# Patient Record
Sex: Male | Born: 1975
Health system: Southern US, Community
[De-identification: ages and names within clinical notes are randomized; demographics above are authoritative.]

## PROBLEM LIST (undated history)

## (undated) DIAGNOSIS — Z0389 Encounter for observation for other suspected diseases and conditions ruled out: Secondary | ICD-10-CM

## (undated) DIAGNOSIS — K219 Gastro-esophageal reflux disease without esophagitis: Secondary | ICD-10-CM

## (undated) DIAGNOSIS — F339 Major depressive disorder, recurrent, unspecified: Secondary | ICD-10-CM

## (undated) DIAGNOSIS — E785 Hyperlipidemia, unspecified: Secondary | ICD-10-CM

## (undated) DIAGNOSIS — E669 Obesity, unspecified: Secondary | ICD-10-CM

## (undated) DIAGNOSIS — I10 Essential (primary) hypertension: Secondary | ICD-10-CM

## (undated) DIAGNOSIS — F411 Generalized anxiety disorder: Secondary | ICD-10-CM

## (undated) DIAGNOSIS — G4733 Obstructive sleep apnea (adult) (pediatric): Secondary | ICD-10-CM

## (undated) DIAGNOSIS — R7989 Other specified abnormal findings of blood chemistry: Secondary | ICD-10-CM

## (undated) DIAGNOSIS — K589 Irritable bowel syndrome without diarrhea: Secondary | ICD-10-CM

## (undated) DIAGNOSIS — K5790 Diverticulosis of intestine, part unspecified, without perforation or abscess without bleeding: Secondary | ICD-10-CM

## (undated) HISTORY — DX: Hyperlipidemia, unspecified: E78.5

## (undated) HISTORY — DX: Gastro-esophageal reflux disease without esophagitis: K21.9

## (undated) HISTORY — DX: Major depressive disorder, recurrent, unspecified: F33.9

## (undated) HISTORY — DX: Encounter for observation for other suspected diseases and conditions ruled out: Z03.89

## (undated) HISTORY — PX: APPENDECTOMY: SHX54

## (undated) HISTORY — PX: ADENOIDECTOMY: SUR15

## (undated) HISTORY — PX: TONSILLECTOMY: SUR1361

## (undated) HISTORY — DX: Other specified abnormal findings of blood chemistry: R79.89

## (undated) HISTORY — DX: Obesity, unspecified: E66.9

## (undated) HISTORY — DX: Obstructive sleep apnea (adult) (pediatric): G47.33

## (undated) HISTORY — PX: OTHER SURGICAL HISTORY: SHX169

## (undated) HISTORY — DX: Irritable bowel syndrome, unspecified: K58.9

## (undated) HISTORY — DX: Generalized anxiety disorder: F41.1

## (undated) HISTORY — DX: Essential (primary) hypertension: I10

---

## 1997-07-15 ENCOUNTER — Ambulatory Visit (HOSPITAL_COMMUNITY): Admission: RE | Admit: 1997-07-15 | Discharge: 1997-07-15 | Payer: Self-pay | Admitting: Pulmonary Disease

## 1998-02-09 ENCOUNTER — Emergency Department (HOSPITAL_COMMUNITY): Admission: EM | Admit: 1998-02-09 | Discharge: 1998-02-09 | Payer: Self-pay | Admitting: Emergency Medicine

## 1998-02-09 ENCOUNTER — Encounter: Payer: Self-pay | Admitting: Emergency Medicine

## 1999-07-12 ENCOUNTER — Encounter: Payer: Self-pay | Admitting: Emergency Medicine

## 1999-07-12 ENCOUNTER — Emergency Department (HOSPITAL_COMMUNITY): Admission: EM | Admit: 1999-07-12 | Discharge: 1999-07-12 | Payer: Self-pay | Admitting: Emergency Medicine

## 2000-06-07 ENCOUNTER — Encounter: Payer: Self-pay | Admitting: Emergency Medicine

## 2000-06-07 ENCOUNTER — Emergency Department (HOSPITAL_COMMUNITY): Admission: EM | Admit: 2000-06-07 | Discharge: 2000-06-07 | Payer: Self-pay | Admitting: Emergency Medicine

## 2000-11-16 ENCOUNTER — Ambulatory Visit (HOSPITAL_BASED_OUTPATIENT_CLINIC_OR_DEPARTMENT_OTHER): Admission: RE | Admit: 2000-11-16 | Discharge: 2000-11-16 | Payer: Self-pay | Admitting: Family Medicine

## 2001-02-25 ENCOUNTER — Emergency Department (HOSPITAL_COMMUNITY): Admission: EM | Admit: 2001-02-25 | Discharge: 2001-02-25 | Payer: Self-pay | Admitting: Emergency Medicine

## 2001-02-25 ENCOUNTER — Encounter: Payer: Self-pay | Admitting: Emergency Medicine

## 2001-02-26 ENCOUNTER — Emergency Department (HOSPITAL_COMMUNITY): Admission: EM | Admit: 2001-02-26 | Discharge: 2001-02-26 | Payer: Self-pay

## 2001-02-26 ENCOUNTER — Encounter: Payer: Self-pay | Admitting: Emergency Medicine

## 2001-04-02 ENCOUNTER — Encounter: Payer: Self-pay | Admitting: Emergency Medicine

## 2001-04-02 ENCOUNTER — Emergency Department (HOSPITAL_COMMUNITY): Admission: EM | Admit: 2001-04-02 | Discharge: 2001-04-02 | Payer: Self-pay | Admitting: *Deleted

## 2001-10-01 ENCOUNTER — Emergency Department (HOSPITAL_COMMUNITY): Admission: EM | Admit: 2001-10-01 | Discharge: 2001-10-01 | Payer: Self-pay

## 2001-10-17 ENCOUNTER — Emergency Department (HOSPITAL_COMMUNITY): Admission: EM | Admit: 2001-10-17 | Discharge: 2001-10-17 | Payer: Self-pay | Admitting: Emergency Medicine

## 2002-03-04 ENCOUNTER — Emergency Department (HOSPITAL_COMMUNITY): Admission: EM | Admit: 2002-03-04 | Discharge: 2002-03-04 | Payer: Self-pay | Admitting: Emergency Medicine

## 2002-03-04 ENCOUNTER — Encounter: Payer: Self-pay | Admitting: Emergency Medicine

## 2002-10-14 ENCOUNTER — Emergency Department (HOSPITAL_COMMUNITY): Admission: EM | Admit: 2002-10-14 | Discharge: 2002-10-14 | Payer: Self-pay | Admitting: Emergency Medicine

## 2002-10-14 ENCOUNTER — Encounter: Payer: Self-pay | Admitting: Emergency Medicine

## 2004-04-20 ENCOUNTER — Emergency Department (HOSPITAL_COMMUNITY): Admission: EM | Admit: 2004-04-20 | Discharge: 2004-04-20 | Payer: Self-pay | Admitting: Emergency Medicine

## 2004-04-30 ENCOUNTER — Observation Stay (HOSPITAL_COMMUNITY): Admission: EM | Admit: 2004-04-30 | Discharge: 2004-05-01 | Payer: Self-pay | Admitting: Emergency Medicine

## 2004-04-30 ENCOUNTER — Ambulatory Visit: Payer: Self-pay | Admitting: Internal Medicine

## 2004-09-19 ENCOUNTER — Emergency Department (HOSPITAL_COMMUNITY): Admission: EM | Admit: 2004-09-19 | Discharge: 2004-09-19 | Payer: Self-pay | Admitting: Emergency Medicine

## 2006-01-07 ENCOUNTER — Emergency Department (HOSPITAL_COMMUNITY): Admission: EM | Admit: 2006-01-07 | Discharge: 2006-01-07 | Payer: Self-pay | Admitting: Emergency Medicine

## 2006-02-06 ENCOUNTER — Emergency Department (HOSPITAL_COMMUNITY): Admission: EM | Admit: 2006-02-06 | Discharge: 2006-02-06 | Payer: Self-pay | Admitting: Emergency Medicine

## 2007-01-11 DIAGNOSIS — IMO0001 Reserved for inherently not codable concepts without codable children: Secondary | ICD-10-CM

## 2007-01-11 HISTORY — DX: Reserved for inherently not codable concepts without codable children: IMO0001

## 2007-05-11 ENCOUNTER — Ambulatory Visit (HOSPITAL_COMMUNITY): Admission: RE | Admit: 2007-05-11 | Discharge: 2007-05-11 | Payer: Self-pay | Admitting: *Deleted

## 2007-05-24 LAB — PULMONARY FUNCTION TEST

## 2007-05-25 ENCOUNTER — Ambulatory Visit (HOSPITAL_COMMUNITY): Admission: RE | Admit: 2007-05-25 | Discharge: 2007-05-25 | Payer: Self-pay | Admitting: Orthopedic Surgery

## 2007-05-30 ENCOUNTER — Ambulatory Visit: Payer: Self-pay | Admitting: Cardiology

## 2007-05-31 ENCOUNTER — Encounter (INDEPENDENT_AMBULATORY_CARE_PROVIDER_SITE_OTHER): Payer: Self-pay | Admitting: Internal Medicine

## 2007-05-31 ENCOUNTER — Inpatient Hospital Stay (HOSPITAL_COMMUNITY): Admission: EM | Admit: 2007-05-31 | Discharge: 2007-06-01 | Payer: Self-pay | Admitting: Emergency Medicine

## 2007-06-12 ENCOUNTER — Ambulatory Visit: Payer: Self-pay

## 2007-10-24 ENCOUNTER — Ambulatory Visit: Payer: Self-pay | Admitting: Internal Medicine

## 2007-10-24 LAB — CONVERTED CEMR LAB
CO2: 28 meq/L (ref 19–32)
Calcium: 9.1 mg/dL (ref 8.4–10.5)
Eosinophils Absolute: 0.4 10*3/uL (ref 0.0–0.7)
Eosinophils Relative: 5.8 % — ABNORMAL HIGH (ref 0.0–5.0)
GFR calc Af Amer: 110 mL/min
Glucose, Bld: 89 mg/dL (ref 70–99)
HCT: 44.5 % (ref 39.0–52.0)
Hemoglobin: 15.4 g/dL (ref 13.0–17.0)
Lymphocytes Relative: 25.8 % (ref 12.0–46.0)
MCHC: 34.5 g/dL (ref 30.0–36.0)
Monocytes Relative: 12.1 % — ABNORMAL HIGH (ref 3.0–12.0)
Neutro Abs: 4.2 10*3/uL (ref 1.4–7.7)
RBC: 5.09 M/uL (ref 4.22–5.81)
RDW: 12.9 % (ref 11.5–14.6)
Sodium: 141 meq/L (ref 135–145)

## 2007-10-26 ENCOUNTER — Ambulatory Visit: Payer: Self-pay | Admitting: Internal Medicine

## 2007-10-26 ENCOUNTER — Ambulatory Visit (HOSPITAL_COMMUNITY): Admission: RE | Admit: 2007-10-26 | Discharge: 2007-10-26 | Payer: Self-pay | Admitting: Cardiology

## 2008-06-16 ENCOUNTER — Telehealth: Payer: Self-pay | Admitting: Internal Medicine

## 2008-06-17 ENCOUNTER — Ambulatory Visit: Payer: Self-pay | Admitting: Internal Medicine

## 2008-06-17 DIAGNOSIS — K219 Gastro-esophageal reflux disease without esophagitis: Secondary | ICD-10-CM | POA: Insufficient documentation

## 2008-06-17 DIAGNOSIS — R1310 Dysphagia, unspecified: Secondary | ICD-10-CM | POA: Insufficient documentation

## 2008-06-17 DIAGNOSIS — R197 Diarrhea, unspecified: Secondary | ICD-10-CM

## 2008-06-17 DIAGNOSIS — R1084 Generalized abdominal pain: Secondary | ICD-10-CM | POA: Insufficient documentation

## 2008-06-17 DIAGNOSIS — K589 Irritable bowel syndrome without diarrhea: Secondary | ICD-10-CM

## 2008-06-17 DIAGNOSIS — K210 Gastro-esophageal reflux disease with esophagitis: Secondary | ICD-10-CM

## 2008-06-17 DIAGNOSIS — R1319 Other dysphagia: Secondary | ICD-10-CM

## 2008-06-18 ENCOUNTER — Encounter: Payer: Self-pay | Admitting: Internal Medicine

## 2008-06-18 LAB — CONVERTED CEMR LAB
ALT: 24 units/L (ref 0–53)
Albumin: 3.8 g/dL (ref 3.5–5.2)
Basophils Absolute: 0 10*3/uL (ref 0.0–0.1)
CO2: 29 meq/L (ref 19–32)
Calcium: 8.9 mg/dL (ref 8.4–10.5)
Chloride: 111 meq/L (ref 96–112)
GFR calc non Af Amer: 101.99 mL/min (ref >60)
Glucose, Bld: 99 mg/dL (ref 70–99)
HCT: 44.5 % (ref 39.0–52.0)
Lymphocytes Relative: 23.9 % (ref 12.0–46.0)
Lymphs Abs: 1.9 10*3/uL (ref 0.7–4.0)
Monocytes Relative: 9.3 % (ref 3.0–12.0)
Neutro Abs: 5 10*3/uL (ref 1.4–7.7)
Neutrophils Relative %: 62.1 % (ref 43.0–77.0)
Platelets: 207 10*3/uL (ref 150.0–400.0)
Potassium: 4.3 meq/L (ref 3.5–5.1)
RBC: 5.02 M/uL (ref 4.22–5.81)
Sodium: 143 meq/L (ref 135–145)

## 2008-06-19 ENCOUNTER — Telehealth (INDEPENDENT_AMBULATORY_CARE_PROVIDER_SITE_OTHER): Payer: Self-pay | Admitting: *Deleted

## 2008-06-23 ENCOUNTER — Ambulatory Visit: Payer: Self-pay | Admitting: Internal Medicine

## 2008-06-24 ENCOUNTER — Encounter (INDEPENDENT_AMBULATORY_CARE_PROVIDER_SITE_OTHER): Payer: Self-pay | Admitting: *Deleted

## 2008-07-16 ENCOUNTER — Ambulatory Visit: Payer: Self-pay | Admitting: Internal Medicine

## 2009-04-01 ENCOUNTER — Encounter: Payer: Self-pay | Admitting: Internal Medicine

## 2009-10-12 ENCOUNTER — Encounter: Payer: Self-pay | Admitting: Internal Medicine

## 2009-11-25 ENCOUNTER — Telehealth: Payer: Self-pay | Admitting: Internal Medicine

## 2009-12-16 ENCOUNTER — Telehealth: Payer: Self-pay | Admitting: Internal Medicine

## 2009-12-30 ENCOUNTER — Ambulatory Visit: Payer: Self-pay | Admitting: Internal Medicine

## 2009-12-31 ENCOUNTER — Telehealth (INDEPENDENT_AMBULATORY_CARE_PROVIDER_SITE_OTHER): Payer: Self-pay | Admitting: *Deleted

## 2010-01-06 ENCOUNTER — Emergency Department (HOSPITAL_COMMUNITY)
Admission: EM | Admit: 2010-01-06 | Discharge: 2010-01-06 | Payer: Self-pay | Source: Home / Self Care | Admitting: Emergency Medicine

## 2010-02-01 ENCOUNTER — Encounter: Payer: Self-pay | Admitting: Otolaryngology

## 2010-02-11 NOTE — Letter (Signed)
Summary: [facility]  [facility]   Imported By: Sherian Rein 10/27/2009 08:45:31  _____________________________________________________________________  External Attachment:    Type:   Image     Comment:   External Document

## 2010-02-11 NOTE — Progress Notes (Signed)
Summary: DO NOT RESCHEDULE  Phone Note Call from Patient   Caller: [email or web address]  Call For: wert Summary of Call: Do you want to reschedule this pt again, he has 3 no-shows and 3 cancellations. Initial call taken by: Darletta Moll,  December 31, 2009 9:13 AM  Follow-up for Phone Call        do not reschedule  Follow-up by: Nyoka Cowden MD,  December 31, 2009 11:48 AM

## 2010-02-11 NOTE — Medication Information (Signed)
[facility]: [facility]   Imported By: Lester Grimsley 04/29/2009 08:22:18  _____________________________________________________________________  External Attachment:    Type:   Image     Comment:   External Document

## 2010-02-11 NOTE — Progress Notes (Signed)
Summary: nos appt  Phone Note Call from Patient   Caller: [email or web address]  Call For: wert Summary of Call: Rsc nos from 11/15 to 12/6. Initial call taken by: Darletta Moll,  November 25, 2009 11:28 AM

## 2010-02-11 NOTE — Progress Notes (Signed)
Summary: nos appt  Phone Note Call from Patient   Caller: [email or web address]  Call For: wert Summary of Call: Rsc nos from 12/6 to 12/21. Initial call taken by: Darletta Moll,  December 16, 2009 11:22 AM

## 2010-02-17 ENCOUNTER — Emergency Department (HOSPITAL_COMMUNITY)
Admission: EM | Admit: 2010-02-17 | Discharge: 2010-02-17 | Disposition: A | Discharge status: Home / Self Care | Payer: Medicare Other | Attending: Emergency Medicine | Admitting: Emergency Medicine

## 2010-02-17 ENCOUNTER — Emergency Department (HOSPITAL_COMMUNITY): Payer: Medicare Other

## 2010-02-17 DIAGNOSIS — T148XXA Other injury of unspecified body region, initial encounter: Secondary | ICD-10-CM | POA: Insufficient documentation

## 2010-02-17 DIAGNOSIS — M79609 Pain in unspecified limb: Secondary | ICD-10-CM | POA: Insufficient documentation

## 2010-02-17 DIAGNOSIS — W268XXA Contact with other sharp object(s), not elsewhere classified, initial encounter: Secondary | ICD-10-CM | POA: Insufficient documentation

## 2010-02-17 DIAGNOSIS — L089 Local infection of the skin and subcutaneous tissue, unspecified: Secondary | ICD-10-CM | POA: Insufficient documentation

## 2010-05-25 NOTE — H&P (Signed)
NAMEPAWAN, KNECHTEL                 ACCOUNT NO.:  [id number]   MEDICAL RECORD NO.:  [id number]          PATIENT TYPE:  INP   LOCATION:  1823                         FACILITY:  [facility]   PHYSICIAN:  Lucita Ferrara, MD         DATE OF BIRTH:  11/21/1975   DATE OF ADMISSION:  05/30/2007  DATE OF DISCHARGE:                              HISTORY & PHYSICAL   HISTORY OF PRESENT ILLNESS:  The patient is a 35 year old with chief  complaint of chest pain was brought in to [facility]  status post removal of a pin from the right femur.  Chest pain is  located on the right side of the chest worse upon inspiration.  It  radiates to right arm.  It is pleuritic in nature.  It is 5/10 in  intensity, some cough and sputum production that is nonproductive.  He  complains of generalized malaise, fatigue and shortness of breath.  No  orthopnea, paroxysmal nocturnal dyspnea or lower extremity edema.  Otherwise, a 12-point review of system is negative.  Again, the patient  recently underwent an orthopedic procedure.  His past medical history is  significant for gastroesophageal reflux disease and asthmatic  bronchitis.   SOCIAL HISTORY:  Denies drugs alcohol or tobacco.   MEDICATIONS:  He is on Roxicet and he is unsure of the other medications  he is on.   FAMILY HISTORY:  Noncontributory   PHYSICAL EXAMINATION:  GENERAL:  The patient is in moderate distress  from his pain when he breathes in, otherwise, no other acute distress.  VITAL SIGNS:  Temperature 90.9, blood pressure 134/86, pulse 105,  respirations 18, pulse ox 97% on room air.  HEENT:  Mucous membranes are dry.  CARDIOVASCULAR:  S1-S2, regular rate and rhythm.  No murmurs, rubs or  clicks.  ABDOMEN:  Soft, nontender, nondistended.  Positive bowel sounds.  LUNGS: There are decreased breath sounds of right lung.  Otherwise,  clear to auscultation.  Some mild wheezing bilaterally expiratory  wheezes.  No rhonchi or rales.  EXTREMITIES:  No clubbing, cyanosis or edema.  NEUROLOGICAL:  The patient is alert and oriented x3.  Cranial nerves II-  XII grossly intact.  ABDOMEN:  Soft, nontender, nondistended.  Positive bowel sounds.   LABORATORY DATA:  The patient had a lipase which is 20.  Complete  metabolic panel within normal limits.  Lipase 20.  First set of cardiac  enzymes mildly elevated troponin of 0.09, second set of troponin 0.18.   RADIOLOGICAL DATA:  The patient has CT angio which was negative for  pulmonary embolism but showed a lung nodule.  Chest x-ray shows patchy  opacity of the right lung base, atelectasis versus airspace disease.  There is a thoracic kyphosis noted.   ASSESSMENT/PLAN:  A 35 year old with chest pain and a questionable right  lung opacity, mild leukocytosis and pleuritic-type chest pain that  really raises suspicion of pneumonia, rule out acute coronary syndrome  given his prior positive cardiac enzymes which could likely be secondary  to his pneumonic process.  Note that a D-dimer  was positive and a CT  angio was performed and PE was ruled out essentially.  The patient is  status post orthopedic procedure which increases the risk of DVT and PE.  1. Chest pain, right lung opacity, likely infectious process.  Will go      ahead and initiate antibiotics with Levaquin IV daily, nebulizer      treatments, supplemental oxygen, blood cultures, sputum culture.  2. Positive cardiac enzymes could be secondary to #1 but will go ahead      and rule out acute coronary syndrome.  Will get an EKG.  Will start      therapeutic Lovenox, dose now.  Cardiology consults will be      required.  A 2-D echocardiogram will be ordered.  The patient      likely needs a catheterization during this admission.  This is per      Cardiology.  3. Positive D-dimer is negative.  CT angio.  4. DVT prophylaxis with therapeutic Lovenox.  GI prophylaxis with      Protonix.  The rest of the plans and procedures  are dependent on      the patient's progress.      Lucita Ferrara, MD  Electronically Signed     RR/MEDQ  D:  05/31/2007  T:  05/31/2007  Job:  [id number]

## 2010-05-25 NOTE — Cardiovascular Report (Signed)
William Mcdonald, William Mcdonald                 ACCOUNT NO.:  [id number]   MEDICAL RECORD NO.:  [id number]          PATIENT TYPE:  OIB   LOCATION:  2899                         FACILITY:  [facility]   PHYSICIAN:  Bevelyn Buckles. Bensimhon, MDDATE OF BIRTH:  [birth year]   DATE OF PROCEDURE:  DATE OF DISCHARGE:  10/26/2007                            CARDIAC CATHETERIZATION   PATIENT IDENTIFICATION:  William Mcdonald is a 35 year old male who was  admitted recently with chest pain.  Troponin was mildly positive.  He  had a Myoview, which showed no evidence of no perfusion defects;  however, he has continued to have chest pain and severe dyspnea.  He is  brought for right and left catheterization.   PROCEDURES PERFORMED:  1. Right heart catheterization.  2. Left heart catheterization.  3. Slash coronary angiography.  4. Left ventriculogram.   DESCRIPTION OF PROCEDURE:  The risks and indications were explained.  Consent was signed and placed on the chart.  A 5-French arterial sheath  was placed in the right femoral artery using a standard modified  Seldinger technique.  Standard catheters including a JL-4, JR-4, and  angled pigtail used for the procedure.  All catheter exchanges were made  over wire.  A 7-French sheath was placed in the right femoral vein.  Using modified Seldinger technique, standard Swan-Ganz catheter was used  for right heart cath.  There is no apparent complications.   HEMODYNAMICS:  Right atrial pressure was mean of 1; RV pressure 21/1; PA  pressure 23/5, with a mean of 13; pulmonary capillary wedge pressure was  4; a central aortic pressure is 124/85, with a mean of 104.  LV pressure  118/2, with an EDP of 7.   Fick cardiac output was 5.8, with a cardiac index of 2.9.   Coronary artery:  Coronary anatomy left main was normal.   LAD was a long vessel coursing to the apex.  It gave off a large  diagonal, it was angiographically normal.   Left circumflex was a large vessel gave off several  marginal branches is  angiographically normal.   Right coronary artery was a dominant vessel.  It was also  angiographically normal.   Left ventriculogram done in the RAO projection showed ejection fraction  of approximately 55-60%.  No regional wall motion abnormalities.   ASSESSMENT:  1. Normal coronary arteries.  2. Normal left ventricular function.  3. Normal right-sided pressures.   Given his catheterization, his shortness of breath and chest pain  appeared to be noncardiac.  We will proceed with cardiopulmonary  exercise test to rule out exercise-induced asthma.  He may also need a  gastrointestinal workup.      Bevelyn Buckles. Bensimhon, MD  Electronically Signed     DRB/MEDQ  D:  10/26/2007  T:  10/27/2007  Job:  [id number]
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
## 2010-05-25 NOTE — Op Note (Signed)
NAMEJERALD, William Mcdonald                 ACCOUNT NO.:  [id number]   MEDICAL RECORD NO.:  [id number]          PATIENT TYPE:  AMB   LOCATION:  SDS                          FACILITY:  [facility]   PHYSICIAN:  Harvie Junior, M.D.   DATE OF BIRTH:  24-Dec-1975   DATE OF PROCEDURE:  06/20/2007  DATE OF DISCHARGE:  05/25/2007                               OPERATIVE REPORT   PREOPERATIVE DIAGNOSIS:  Retained hardware right leg status post  intramedullary rodding.   POSTOPERATIVE DIAGNOSES:  1. Retained hardware, right leg status post intramedullary rodding      (excision of trochanteric bursa right).  2. Removal of femoral rod from a proximal incision.  3. Removal of proximal femoral screw from a separate proximal      incision.  4. Removal of distal femoral screws from a lateral distal incision.   SURGEON:  Harvie Junior, MD   ASSISTANT:  Marshia Ly, PA   ANESTHESIA:  General.   BRIEF HISTORY:  [name] is a gentleman who underwent intramedullary  rodding of his right femur prolonged time ago.  He had been bothered by  this for a period of time and we have been followed and conservatively  have been avoiding surgical invention because we felt that he would just  get better.  He continued to complain of pain over that hip.  He  certainly seemed to have some infected and inflamed bursa.  We had  injected that was seemed to have helped, but he continued to complain of  pain.  The patient was ultimately taken to the operating room for  removal of rod.   PROCEDURE:  The patient was taken to the operating room.  After adequate  anesthesia obtained with general anesthetic, the patient was placed in  the left lateral decubitus position, all bony prominences were well  padded.  Attention then turned to the right hip where a distal incision  was made.  Subcutaneous tissue dissected in the level of the lateral  distal femur and the 2 distal femoral screws were removed through the  distal incision.   Attention then turned proximally where a small stab  incision was made over the proximal wound and the proximal interlocking  screw was removed through the small wound.  A fairly generous incision  was made over the proximal aspect of the hip as this was just going  through the old incision.  Once this was done, we dissected down to the  lateral aspect to the hip.  He had a tremendous amount of trochanteric  inflammation, trochanteric bursa, and a subtotal trochanteric bursectomy  was performed.  At this point, the top of the rod was identifiable and  this top of the rod was cleansed of all of the healing tissue and scar  tissue around it.  Once this was done, the extracting rod was placed  into the proximal rod and the rod that was removed.  The wound was  copiously and thoroughly irrigated at this point as well as pulsatile  lavage of the femoral shaft, and at this point, the proximal  wound was  closed in layers.  The other 2 incisions were closed with the skin  staples.  Sterile compression dressings were applied.  The patient was  taken to  the recovery room and was noted to be in satisfactory condition.  Estimated blood loss during the procedure was less than 100 mL.   Of note, Marshia Ly assisted throughout the case and its completion.      Harvie Junior, M.D.  Electronically Signed     JLG/MEDQ  D:  06/20/2007  T:  06/21/2007  Job:  [id number]

## 2010-05-25 NOTE — Assessment & Plan Note (Signed)
[facility]                            CARDIOLOGY OFFICE NOTE   KEEON, Mcdonald                        MRN:          [id number]  DATE:10/24/2007                            DOB:          1975/09/21    PRIMARY Viviana Trimble:  Paulene Floor [facility]   William Mcdonald is a very pleasant 35 year old male with a family history of  premature coronary artery disease.  He was admitted back in May with  chest pain.  His troponin was just minimally elevated at 0.18.  EKG  showed inferior T-waves.  However, his pain was somewhat atypical.  He  had a CT scan which showed no evidence of pulmonary embolus.  This was  followed by Myoview which showed a possible minimal defect in the  inferior apical wall.  This was thought not to be clinically  significant.  EF was 53% and by echocardiogram was also 50%.   He continues to have chest pains.  He says what really happens is he  gets short of breath and then he develops chest pain.  This happens  mostly at night.  He has been evaluated by ENT and started on a proton  pump inhibitor without any benefit.  He has been found to have a  deviated septum and this is going to be repaired in the near future.  There is also some question of possible laryngeal dysfunction as he has  a hard time swallowing pills and often gags.  They are working this up  as well.   He also gets the pain when he is exerting himself.  Walking at a normal  pace, he has no problems.  When he takes a long walk or goes faster he  develops shortness of breath and has chest pain.  He has been followed  by [facility].  They started him on albuterol  inhaler which helped a bit but then apparently he had PFTs which did not  show a reactive airway disease at rest.   He says that he has his discomfort and shortness of breath just about  every day and seems to be progressive.   CURRENT MEDICATIONS:  None currently.   EKG shows sinus bradycardia with sinus arrhythmia at a rate of 51.  There are lateral T-wave inversions which are old.   PHYSICAL EXAM:  CONSTITUTIONAL:  He is in no acute distress.  Ambulates  around the clinic without any respiratory difficulty.  VITAL SIGNS:  Blood pressure 120/82, heart rate 51 weight is 202.  HEENT is normal.  NECK:  Supple.  No obvious JVD.  Carotids 2+ bilaterally without bruits.  There is no lymphadenopathy or thyromegaly.  CARDIAC:  Regular rate and rhythm.  No murmurs, rubs or gallops.  LUNGS:  Clear.  No wheezing.  ABDOMEN:  Obese, nontender, nondistended with no hepatosplenomegaly, no  bruits, no masses.  Good bowel sounds.  EXTREMITIES:  Warm with no cyanosis, clubbing or edema.  No rash.  NEURO:  Alert and oriented x3.  Cranial nerves II-XII are intact.  Moves  all 4 extremities  without difficulty.  Affect is pleasant.   ASSESSMENT/PLAN:  Shortness of breath and chest pain with an abnormal  Myoview and mildly elevated troponins.  I had a long talk with Mr. Bussey  and his wife about his symptoms.  I am concerned about possible  underlying coronary artery disease.  We will proceed with right and left  heart catheterization in the cardiac catheterization lab on Friday.  We  had a long discussion about the risks and indication.  He agrees to  proceed.     Bevelyn Buckles. Bensimhon, MD  Electronically Signed    DRB/MedQ  DD: 10/24/2007  DT: 10/24/2007  Job #: [id number]   cc:   Paulene Floor

## 2010-05-25 NOTE — Discharge Summary (Signed)
[name], [name]                 ACCOUNT NO.:  [id number]   MEDICAL RECORD NO.:  [id number]           PATIENT TYPE:   LOCATION:                                 FACILITY:   PHYSICIAN:  Elliot Cousin, M.D.    DATE OF BIRTH:  06-27-1975   DATE OF ADMISSION:  05/31/2007  DATE OF DISCHARGE:  06/01/2007                               DISCHARGE SUMMARY   DISCHARGE DIAGNOSES:  1. Atypical chest pain associated with abdominal pain, thought to be      secondary to constipation.  2. Mildly elevated troponin I on the cardiac marker panel, however,      cardiac enzymes were within normal limits, otherwise.  3. Lateral T-wave inversions.  An outpatient Myoview stress test has      been scheduled for June 12, 2007, per Dr. Gala Romney.  4. Mild dyslipidemia.  5. Constipation.   DISCHARGE MEDICATIONS:  None.   DISCHARGE DISPOSITION:  The patient is currently being discharged to  home in improved and stable condition.  He was advised to follow up with  [facility] on June 12, 2007, for the outpatient Myoview stress  test.  He was also advised to follow up with Dr. Gala Romney on July 02, 2007.   CONSULTATIONS:  Cardiologist, Lowella Bandy, MD and Bevelyn Buckles. Bensimhon,  MD.   PROCEDURES PERFORMED:  1. Ultrasound of the abdomen on May 31, 2007.  The results revealed no      evidence of cholelithiasis or cholecystitis.  Simple right renal      cyst.  2. CT angiogram of the chest on May 31, 2007.  The results revealed      negative CT scan of the chest.  No evidence for pulmonary embolism.      A 3-mm nodule in the right upper lobe, otherwise lungs are clear.      The right pulmonary nodule is likely an incidental finding.  3. A 2-D echocardiogram performed on May 31, 2007.  The results      revealed an ejection fraction estimated to be 45%-50% with mild      diffuse left ventricular hypokinesis.  Per Dr. Prescott Gum      assessment, the patient's ejection fraction ranges from 50%-55%.   HISTORY OF PRESENT ILLNESS:  The patient is a 36 year old man with a  recent history of right lower extremity orthopedic surgery, who  presented to the emergency department on May 31, 2007, with a chief  complaint of pleuritic right-sided chest pain.  When the patient was  evaluated in the emergency department, his troponin I was elevated at  0.09.  His EKG revealed nonspecific lateral T-wave abnormalities.  His  ABG as ordered by the emergency department physician revealed a pH of  7.4, pCO2 of 38, and pO2 of 80.  A D-dimer was ordered as well and it  was elevated at 0.78.  Given the elevated D-dimer, a CT scan of the  chest was ordered to rule out PE.  The CT scan of the chest did not  reveal any evidence of pulmonary embolism.  The  patient, however, was  admitted for further evaluation and management.   For additional details, please see the dictated history and physical.   HOSPITAL COURSE:  1. ATYPICAL CHEST PAIN WITH ASSOCIATED MILD ABDOMINAL PAIN.  The      patient was started on heparin intravenously empirically given the      elevated troponin I on the cardiac marker panel from the emergency      department.  The patient's pain was treated with as-needed      oxycodone and as-needed Dilaudid.  Nitroglycerin sublingually was      ordered, however, the patient refused it.  There was a question of      a right-sided opacity on the chest x-ray, which could have been      construed as infection.  However, the patient denied any complaints      of subjective fever or a purulent cough.  Nevertheless, the patient      was started on Levaquin and azithromycin intravenously.  Following      the results of the CT scan which revealed clear lungs, the Levaquin      and subsequently the azithromycin were both discontinued.  Upon      further questioning of the patient, he felt that the pain was more      abdominal and not really chest related. Therefore, the patient was      started on  symptomatic treatment with Mylanta p.r.n.  He was      previously started on Protonix prophylactically.  In addition, the      patient complained of constipation and stated that he had not had a      bowel movement in 3-4 days.  He attributed the constipation to      taking chronic opioid pain medications for leg pain following      surgery.   For additional evaluation, cardiac enzymes were ordered as well as a 2-D  echocardiogram.  The patient's cardiac enzymes were completely within  normal limits.  His 2-D echocardiogram revealed a slightly depressed LV  function with an ejection fraction estimated to be 45%.  In addition,  the patient's TSH was within normal limits at 1.9.  His liver  transaminases were within normal limits.  His fasting lipid panel  revealed a total cholesterol of 168, triglycerides of 146, HDL  cholesterol of 27, and LDL cholesterol of 112.  No pharmacological  treatment was initiated.  The patient, however, was advised to follow a  heart-healthy diet.  His urine drug screen was negative with the  exception of opioids, which he took previously because of pain.   For further evaluation, cardiology was consulted.  Dr. Nevin Bloodgood provided  the initial consultation followed by Dr. Gala Romney.  They both agree  with medical management.  Dr. Gala Romney evaluated the patient and then  took a look at the 2-D echocardiogram.  Per his assessment, the  patient's ejection fraction was actually higher, ranging from 50%-55%.  However, given the EKG changes, Dr. Gala Romney recommended an outpatient  Myoview stress test, which has been arranged.  Because of the patient's  GI symptoms, an abdominal ultrasound was ordered.  The abdominal  ultrasound revealed no evidence of acute cholecystitis or any evidence  of gallstones.  The patient was treated symptomatically with MiraLax,  milk of magnesia, and Senokot-S.  Following these measures, he did have  at least 2 bowel movements.  Following  the bowel movements, the  patient's pain completely resolved.  The patient will be started on a solid food today and if he can tolerate  it, he will be discharged home with close followup by [facility].      Elliot Cousin, M.D.  Electronically Signed     DF/MEDQ  D:  06/01/2007  T:  06/02/2007  Job:  [id number]   cc:   Bevelyn Buckles. Bensimhon, MD
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
## 2010-08-24 ENCOUNTER — Encounter: Payer: Self-pay | Admitting: Internal Medicine

## 2010-08-25 ENCOUNTER — Ambulatory Visit: Payer: Medicare Other | Admitting: Internal Medicine

## 2010-10-06 LAB — COMPREHENSIVE METABOLIC PANEL
ALT: 41
AST: 33
Albumin: 3.5
Alkaline Phosphatase: 62
BUN: 19
Creatinine, Ser: 1.12
GFR calc non Af Amer: >60
Potassium: 3.9
Sodium: 138

## 2010-10-06 LAB — CBC
HCT: 44.9
HCT: 45.8
Hemoglobin: 15.1
MCHC: 33.6
MCHC: 35.1
MCV: 87.9
MCV: 87.4
MCV: 87.8
Platelets: 313
Platelets: 318
RBC: 5.11
RBC: 4.91
RDW: 13
WBC: 12.2 — ABNORMAL HIGH
WBC: 12.4 — ABNORMAL HIGH

## 2010-10-06 LAB — LIPID PANEL
Cholesterol: 168
HDL: 27 — ABNORMAL LOW
LDL Cholesterol: 112 — ABNORMAL HIGH
Triglycerides: 146

## 2010-10-06 LAB — BASIC METABOLIC PANEL
Chloride: 100
GFR calc Af Amer: >60
Potassium: 4.2

## 2010-10-06 LAB — URINALYSIS, ROUTINE W REFLEX MICROSCOPIC
Bilirubin Urine: NEGATIVE
Hgb urine dipstick: NEGATIVE
Ketones, ur: NEGATIVE

## 2010-10-06 LAB — D-DIMER, QUANTITATIVE: D-Dimer, Quant: 0.78 — ABNORMAL HIGH

## 2010-10-06 LAB — RAPID URINE DRUG SCREEN, HOSP PERFORMED
Barbiturates: NOT DETECTED
Cocaine: NOT DETECTED

## 2010-10-06 LAB — CARDIAC PANEL(CRET KIN+CKTOT+MB+TROPI)
CK, MB: 1.2
CK, MB: 1.3
Relative Index: 0.7
Total CK: 124
Total CK: 155
Total CK: 185
Troponin I: 0.01
Troponin I: <0.01

## 2010-10-06 LAB — DIFFERENTIAL
Basophils Absolute: 0
Basophils Relative: 0
Eosinophils Absolute: 0.7
Monocytes Relative: 8
Neutrophils Relative %: 68

## 2010-10-06 LAB — POCT I-STAT 3, ART BLOOD GAS (G3+)
Bicarbonate: 25.5 — ABNORMAL HIGH
Operator id: 284591
pH, Arterial: 7.432
pO2, Arterial: 80

## 2010-10-06 LAB — HEMOGLOBIN A1C
Hgb A1c MFr Bld: 5.7
Mean Plasma Glucose: 126

## 2010-10-06 LAB — POCT CARDIAC MARKERS: Troponin i, poc: 0.09 — ABNORMAL HIGH

## 2010-10-06 LAB — HEPATIC FUNCTION PANEL
ALT: 46
AST: 32
Indirect Bilirubin: 1 — ABNORMAL HIGH
Total Protein: 6.8

## 2010-10-06 LAB — PROTIME-INR: INR: 1.1

## 2010-10-06 LAB — CK TOTAL AND CKMB (NOT AT ARMC)
CK, MB: 1.3
Total CK: 205

## 2010-10-06 LAB — URINE CULTURE
Colony Count: NO GROWTH
Culture: NO GROWTH

## 2010-10-06 LAB — HEPARIN LEVEL (UNFRACTIONATED): Heparin Unfractionated: 0.26 — ABNORMAL LOW

## 2010-10-06 LAB — HOMOCYSTEINE: Homocysteine: 10.7

## 2010-10-06 LAB — LIPASE, BLOOD: Lipase: 20

## 2010-10-11 LAB — POCT I-STAT 3, VENOUS BLOOD GAS (G3P V)
Bicarbonate: 25 — ABNORMAL HIGH
TCO2: 24
TCO2: 26
pCO2, Ven: 41.7 — ABNORMAL LOW
pCO2, Ven: 42.6 — ABNORMAL LOW
pH, Ven: 7.349 — ABNORMAL HIGH
pH, Ven: 7.376 — ABNORMAL HIGH
pO2, Ven: 38

## 2010-10-11 LAB — POCT I-STAT 3, ART BLOOD GAS (G3+)
O2 Saturation: 94
TCO2: 26
pH, Arterial: 7.413

## 2011-10-25 ENCOUNTER — Encounter: Payer: Self-pay | Admitting: Family Medicine

## 2011-10-25 ENCOUNTER — Other Ambulatory Visit: Payer: Self-pay | Admitting: Family Medicine

## 2011-10-25 ENCOUNTER — Ambulatory Visit (INDEPENDENT_AMBULATORY_CARE_PROVIDER_SITE_OTHER): Payer: Medicare Other | Admitting: Family Medicine

## 2011-10-25 VITALS — BP 151/99 | HR 60 | Temp 97.3°F | Ht 65.0 in | Wt 210.0 lb

## 2011-10-25 DIAGNOSIS — G4733 Obstructive sleep apnea (adult) (pediatric): Secondary | ICD-10-CM | POA: Insufficient documentation

## 2011-10-25 DIAGNOSIS — R3911 Hesitancy of micturition: Secondary | ICD-10-CM | POA: Diagnosis not present

## 2011-10-25 DIAGNOSIS — K589 Irritable bowel syndrome without diarrhea: Secondary | ICD-10-CM

## 2011-10-25 DIAGNOSIS — R197 Diarrhea, unspecified: Secondary | ICD-10-CM

## 2011-10-25 DIAGNOSIS — K219 Gastro-esophageal reflux disease without esophagitis: Secondary | ICD-10-CM | POA: Diagnosis not present

## 2011-10-25 DIAGNOSIS — G4730 Sleep apnea, unspecified: Secondary | ICD-10-CM

## 2011-10-25 DIAGNOSIS — I10 Essential (primary) hypertension: Secondary | ICD-10-CM | POA: Diagnosis not present

## 2011-10-25 DIAGNOSIS — Z0000 Encounter for general adult medical examination without abnormal findings: Secondary | ICD-10-CM

## 2011-10-25 DIAGNOSIS — R5381 Other malaise: Secondary | ICD-10-CM | POA: Diagnosis not present

## 2011-10-25 DIAGNOSIS — E785 Hyperlipidemia, unspecified: Secondary | ICD-10-CM

## 2011-10-25 DIAGNOSIS — R1013 Epigastric pain: Secondary | ICD-10-CM | POA: Diagnosis not present

## 2011-10-25 DIAGNOSIS — J45909 Unspecified asthma, uncomplicated: Secondary | ICD-10-CM

## 2011-10-25 DIAGNOSIS — R7989 Other specified abnormal findings of blood chemistry: Secondary | ICD-10-CM

## 2011-10-25 DIAGNOSIS — J309 Allergic rhinitis, unspecified: Secondary | ICD-10-CM

## 2011-10-25 DIAGNOSIS — E291 Testicular hypofunction: Secondary | ICD-10-CM

## 2011-10-25 DIAGNOSIS — R5383 Other fatigue: Secondary | ICD-10-CM

## 2011-10-25 DIAGNOSIS — E669 Obesity, unspecified: Secondary | ICD-10-CM

## 2011-10-25 LAB — POCT URINALYSIS DIPSTICK
Glucose, UA: NEGATIVE
Ketones, UA: NEGATIVE
Leukocytes, UA: NEGATIVE
Protein, UA: NEGATIVE
Urobilinogen, UA: 0.2

## 2011-10-25 MED ORDER — RANITIDINE HCL 300 MG PO TABS: 300.0000 mg | ORAL_TABLET | Freq: Every day | ORAL | Status: DC

## 2011-10-25 MED ORDER — OMEPRAZOLE 20 MG PO TBEC: 20.0000 mg | DELAYED_RELEASE_TABLET | Freq: Every day | ORAL | Status: DC

## 2011-10-25 MED ORDER — CETIRIZINE HCL 10 MG PO TABS: 10.0000 mg | ORAL_TABLET | Freq: Every day | ORAL | Status: DC

## 2011-10-25 MED ORDER — NEBIVOLOL HCL 10 MG PO TABS: 5.0000 mg | ORAL_TABLET | Freq: Every day | ORAL | Status: DC

## 2011-10-25 MED ORDER — ALBUTEROL SULFATE HFA 108 (90 BASE) MCG/ACT IN AERS: 2.0000 | INHALATION_SPRAY | Freq: Four times a day (QID) | RESPIRATORY_TRACT | Status: DC | PRN

## 2011-10-25 NOTE — Patient Instructions (Addendum)
Preventive Care for Adults, Male A healthy lifestyle and preventive care can promote health and wellness. Preventive health guidelines for men include the following key practices:  A routine yearly physical is a good way to check with your caregiver about your health and preventative screening. It is a chance to share any concerns and updates on your health, and to receive a thorough exam.  Visit your dentist for a routine exam and preventative care every 6 months. Brush your teeth twice a day and floss once a day. Good oral hygiene prevents tooth decay and gum disease.  The frequency of eye exams is based on your age, health, family medical history, use of contact lenses, and other factors. Follow your caregiver's recommendations for frequency of eye exams.  Eat a healthy diet. Foods like vegetables, fruits, whole grains, low-fat dairy products, and lean protein foods contain the nutrients you need without too many calories. Decrease your intake of foods high in solid fats, added sugars, and salt. Eat the right amount of calories for you.Get information about a proper diet from your caregiver, if necessary.  Regular physical exercise is one of the most important things you can do for your health. Most adults should get at least 150 minutes of moderate-intensity exercise (any activity that increases your heart rate and causes you to sweat) each week. In addition, most adults need muscle-strengthening exercises on 2 or more days a week.  Maintain a healthy weight. The body mass index (BMI) is a screening tool to identify possible weight problems. It provides an estimate of body fat based on height and weight. Your caregiver can help determine your BMI, and can help you achieve or maintain a healthy weight.For adults 20 years and older:  A BMI below 18.5 is considered underweight.  A BMI of 18.5 to 24.9 is normal.  A BMI of 25 to 29.9 is considered overweight.  A BMI of 30 and above is  considered obese.  Maintain normal blood lipids and cholesterol levels by exercising and minimizing your intake of saturated fat. Eat a balanced diet with plenty of fruit and vegetables. Blood tests for lipids and cholesterol should begin at age 20 and be repeated every 5 years. If your lipid or cholesterol levels are high, you are over 50, or you are a high risk for heart disease, you may need your cholesterol levels checked more frequently.Ongoing high lipid and cholesterol levels should be treated with medicines if diet and exercise are not effective.  If you smoke, find out from your caregiver how to quit. If you do not use tobacco, do not start.  If you choose to drink alcohol, do not exceed 2 drinks per day. One drink is considered to be 12 ounces (355 mL) of beer, 5 ounces (148 mL) of wine, or 1.5 ounces (44 mL) of liquor.  Avoid use of street drugs. Do not share needles with anyone. Ask for help if you need support or instructions about stopping the use of drugs.  High blood pressure causes heart disease and increases the risk of stroke. Your blood pressure should be checked at least every 1 to 2 years. Ongoing high blood pressure should be treated with medicines, if weight loss and exercise are not effective.  If you are 45 to 38 years old, ask your caregiver if you should take aspirin to prevent heart disease.  Diabetes screening involves taking a blood sample to check your fasting blood sugar level. This should be done once every 3 years,   after age 45, if you are within normal weight and without risk factors for diabetes. Testing should be considered at a younger age or be carried out more frequently if you are overweight and have at least 1 risk factor for diabetes.  Colorectal cancer can be detected and often prevented. Most routine colorectal cancer screening begins at the age of 50 and continues through age 75. However, your caregiver may recommend screening at an earlier age if you  have risk factors for colon cancer. On a yearly basis, your caregiver may provide home test kits to check for hidden blood in the stool. Use of a small camera at the end of a tube, to directly examine the colon (sigmoidoscopy or colonoscopy), can detect the earliest forms of colorectal cancer. Talk to your caregiver about this at age 50, when routine screening begins. Direct examination of the colon should be repeated every 5 to 10 years through age 75, unless early forms of pre-cancerous polyps or small growths are found.  Hepatitis C blood testing is recommended for all people born from 1945 through 1965 and any individual with known risks for hepatitis C.  Practice safe sex. Use condoms and avoid high-risk sexual practices to reduce the spread of sexually transmitted infections (STIs). STIs include gonorrhea, chlamydia, syphilis, trichomonas, herpes, HPV, and human immunodeficiency virus (HIV). Herpes, HIV, and HPV are viral illnesses that have no cure. They can result in disability, cancer, and death.  A one-time screening for abdominal aortic aneurysm (AAA) and surgical repair of large AAAs by sound wave imaging (ultrasonography) is recommended for ages 65 to 75 years who are current or former smokers.  Healthy men should no longer receive prostate-specific antigen (PSA) blood tests as part of routine cancer screening. Consult with your caregiver about prostate cancer screening.  Testicular cancer screening is not recommended for adult males who have no symptoms. Screening includes self-exam, caregiver exam, and other screening tests. Consult with your caregiver about any symptoms you have or any concerns you have about testicular cancer.  Use sunscreen with skin protection factor (SPF) of 30 or more. Apply sunscreen liberally and repeatedly throughout the day. You should seek shade when your shadow is shorter than you. Protect yourself by wearing long sleeves, pants, a wide-brimmed hat, and  sunglasses year round, whenever you are outdoors.  Once a month, do a whole body skin exam, using a mirror to look at the skin on your back. Notify your caregiver of new moles, moles that have irregular borders, moles that are larger than a pencil eraser, or moles that have changed in shape or color.  Stay current with required immunizations.  Influenza. You need a dose every fall (or winter). The composition of the flu vaccine changes each year, so being vaccinated once is not enough.  Pneumococcal polysaccharide. You need 1 to 2 doses if you smoke cigarettes or if you have certain chronic medical conditions. You need 1 dose at age 65 (or older) if you have never been vaccinated.  Tetanus, diphtheria, pertussis (Tdap, Td). Get 1 dose of Tdap vaccine if you are younger than age 65 years, are over 65 and have contact with an infant, are a healthcare worker, or simply want to be protected from whooping cough. After that, you need a Td booster dose every 10 years. Consult your caregiver if you have not had at least 3 tetanus and diphtheria-containing shots sometime in your life or have a deep or dirty wound.  HPV. This vaccine is recommended   for males 13 through 38 years of age. This vaccine may be given to men 22 through 38 years of age who have not completed the 3 dose series. It is recommended for men through age 26 who have sex with men or whose immune system is weakened because of HIV infection, other illness, or medications. The vaccine is given in 3 doses over 6 months.  Measles, mumps, rubella (MMR). You need at least 1 dose of MMR if you were born in 1957 or later. You may also need a 2nd dose.  Meningococcal. If you are age 19 to 21 years and a first-year college student living in a residence hall, or have one of several medical conditions, you need to get vaccinated against meningococcal disease. You may also need additional booster doses.  Zoster (shingles). If you are age 60 years or  older, you should get this vaccine.  Varicella (chickenpox). If you have never had chickenpox or you were vaccinated but received only 1 dose, talk to your caregiver to find out if you need this vaccine.  Hepatitis A. You need this vaccine if you have a specific risk factor for hepatitis A virus infection, or you simply wish to be protected from this disease. The vaccine is usually given as 2 doses, 6 to 18 months apart.  Hepatitis B. You need this vaccine if you have a specific risk factor for hepatitis B virus infection or you simply wish to be protected from this disease. The vaccine is given in 3 doses, usually over 6 months. Preventative Service / Frequency Ages 19 to 39  Blood pressure check.** / Every 1 to 2 years.  Lipid and cholesterol check.** / Every 5 years beginning at age 20.  Hepatitis C blood test.** / For any individual with known risks for hepatitis C.  Skin self-exam. / Monthly.  Influenza immunization.** / Every year.  Pneumococcal polysaccharide immunization.** / 1 to 2 doses if you smoke cigarettes or if you have certain chronic medical conditions.  Tetanus, diphtheria, pertussis (Tdap,Td) immunization. / A one-time dose of Tdap vaccine. After that, you need a Td booster dose every 10 years.  HPV immunization. / 3 doses over 6 months, if 26 and younger.  Measles, mumps, rubella (MMR) immunization. / You need at least 1 dose of MMR if you were born in 1957 or later. You may also need a 2nd dose.  Meningococcal immunization. / 1 dose if you are age 19 to 21 years and a first-year college student living in a residence hall, or have one of several medical conditions, you need to get vaccinated against meningococcal disease. You may also need additional booster doses.  Varicella immunization.** / Consult your caregiver.  Hepatitis A immunization.** / Consult your caregiver. 2 doses, 6 to 18 months apart.  Hepatitis B immunization.** / Consult your caregiver. 3 doses  usually over 6 months. Ages 40 to 64  Blood pressure check.** / Every 1 to 2 years.  Lipid and cholesterol check.** / Every 5 years beginning at age 20.  Fecal occult blood test (FOBT) of stool. / Every year beginning at age 50 and continuing until age 75. You may not have to do this test if you get colonoscopy every 10 years.  Flexible sigmoidoscopy** or colonoscopy.** / Every 5 years for a flexible sigmoidoscopy or every 10 years for a colonoscopy beginning at age 50 and continuing until age 75.  Hepatitis C blood test.** / For all people born from 1945 through 1965 and any   individual with known risks for hepatitis C.  Skin self-exam. / Monthly.  Influenza immunization.** / Every year.  Pneumococcal polysaccharide immunization.** / 1 to 2 doses if you smoke cigarettes or if you have certain chronic medical conditions.  Tetanus, diphtheria, pertussis (Tdap/Td) immunization.** / A one-time dose of Tdap vaccine. After that, you need a Td booster dose every 10 years.  Measles, mumps, rubella (MMR) immunization. / You need at least 1 dose of MMR if you were born in 1957 or later. You may also need a 2nd dose.  Varicella immunization.**/ Consult your caregiver.  Meningococcal immunization.** / Consult your caregiver.  Hepatitis A immunization.** / Consult your caregiver. 2 doses, 6 to 18 months apart.  Hepatitis B immunization.** / Consult your caregiver. 3 doses, usually over 6 months. Ages 65 and over  Blood pressure check.** / Every 1 to 2 years.  Lipid and cholesterol check.**/ Every 5 years beginning at age 20.  Fecal occult blood test (FOBT) of stool. / Every year beginning at age 50 and continuing until age 75. You may not have to do this test if you get colonoscopy every 10 years.  Flexible sigmoidoscopy** or colonoscopy.** / Every 5 years for a flexible sigmoidoscopy or every 10 years for a colonoscopy beginning at age 50 and continuing until age 75.  Hepatitis C blood  test.** / For all people born from 1945 through 1965 and any individual with known risks for hepatitis C.  Abdominal aortic aneurysm (AAA) screening.** / A one-time screening for ages 65 to 75 years who are current or former smokers.  Skin self-exam. / Monthly.  Influenza immunization.** / Every year.  Pneumococcal polysaccharide immunization.** / 1 dose at age 65 (or older) if you have never been vaccinated.  Tetanus, diphtheria, pertussis (Tdap, Td) immunization. / A one-time dose of Tdap vaccine if you are over 65 and have contact with an infant, are a healthcare worker, or simply want to be protected from whooping cough. After that, you need a Td booster dose every 10 years.  Varicella immunization. ** / Consult your caregiver.  Meningococcal immunization.** / Consult your caregiver.  Hepatitis A immunization. ** / Consult your caregiver. 2 doses, 6 to 18 months apart.  Hepatitis B immunization.** / Check with your caregiver. 3 doses, usually over 6 months. **Family history and personal history of risk and conditions may change your caregiver's recommendations. Document Released: 02/22/2001 Document Revised: 03/21/2011 Document Reviewed: 05/24/2010 [employer] Patient Information 2013 [employer].  

## 2011-10-26 ENCOUNTER — Encounter: Payer: Self-pay | Admitting: Internal Medicine

## 2011-10-26 LAB — CBC
MCV: 86.9 fL (ref 78.0–100.0)
Platelets: 277 10*3/uL (ref 150–400)
RBC: 5.19 MIL/uL (ref 4.22–5.81)
WBC: 7.5 10*3/uL (ref 4.0–10.5)

## 2011-10-26 LAB — BASIC METABOLIC PANEL
CO2: 24 mEq/L (ref 19–32)
Chloride: 107 mEq/L (ref 96–112)
Glucose, Bld: 92 mg/dL (ref 70–99)
Potassium: 4 mEq/L (ref 3.5–5.3)
Sodium: 139 mEq/L (ref 135–145)

## 2011-10-26 LAB — HEPATIC FUNCTION PANEL
ALT: 32 U/L (ref 0–53)
AST: 24 U/L (ref 0–37)
Alkaline Phosphatase: 63 U/L (ref 39–117)
Indirect Bilirubin: 0.5 mg/dL (ref 0.0–0.9)
Total Protein: 7 g/dL (ref 6.0–8.3)

## 2011-10-26 LAB — PSA: PSA: 0.3 ng/mL (ref <=4.00)

## 2011-10-26 LAB — H. PYLORI ANTIBODY, IGG: H Pylori IgG: 0.45 {ISR}

## 2011-10-26 LAB — LIPASE: Lipase: 17 U/L (ref 0–75)

## 2011-10-27 ENCOUNTER — Encounter: Payer: Self-pay | Admitting: Family Medicine

## 2011-10-27 DIAGNOSIS — E785 Hyperlipidemia, unspecified: Secondary | ICD-10-CM

## 2011-10-27 DIAGNOSIS — R7989 Other specified abnormal findings of blood chemistry: Secondary | ICD-10-CM | POA: Insufficient documentation

## 2011-10-27 DIAGNOSIS — Z0000 Encounter for general adult medical examination without abnormal findings: Secondary | ICD-10-CM | POA: Insufficient documentation

## 2011-10-27 HISTORY — DX: Hyperlipidemia, unspecified: E78.5

## 2011-10-27 HISTORY — DX: Other specified abnormal findings of blood chemistry: R79.89

## 2011-10-27 NOTE — Assessment & Plan Note (Signed)
Has previously been on a Bipap machine but broke it a couple of years ago and has not been treated since. He is referred for further sleep studies and evaluation by pulmonolgy.

## 2011-10-27 NOTE — Assessment & Plan Note (Signed)
Poorly controlled BP. Encouraged DASH diet and avoidance of stimulants, started on Bystolic 10 mg daily, reassess at next visit

## 2011-10-27 NOTE — Assessment & Plan Note (Signed)
Mild elevation of LDL cholesterol and suppressed HDL. Would benefit from fatty acid supplement and avoidance of trans fats.

## 2011-10-27 NOTE — Assessment & Plan Note (Signed)
Found on labs today, will discuss options when he returns for short interval follow up

## 2011-10-27 NOTE — Assessment & Plan Note (Signed)
Encouraged probiotics and fiber supplement.
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
## 2011-10-27 NOTE — Assessment & Plan Note (Signed)
Has had trouble with this for many years. Is encouraged to avoid offending foods. Will continue Omeprazole and start Ranitidine. H Pylori is negative today. Due to the severity and persistence of her symptoms he is referred to gastroenterology for further consideration.

## 2011-10-27 NOTE — Assessment & Plan Note (Signed)
Given handout on DASH diet. Encouraged decreased po intake and increased activity as tolerated.

## 2011-10-27 NOTE — Assessment & Plan Note (Signed)
Tdap 2010, declines flu shot today. Request old records and encouraged DASH/heart healthy diet.

## 2011-10-27 NOTE — Assessment & Plan Note (Addendum)
Albuterol use prn for now. C/o frequent SOB

## 2011-11-08 ENCOUNTER — Encounter: Payer: Self-pay | Admitting: Family Medicine

## 2011-11-08 ENCOUNTER — Ambulatory Visit (INDEPENDENT_AMBULATORY_CARE_PROVIDER_SITE_OTHER): Payer: Medicare Other | Admitting: Family Medicine

## 2011-11-08 VITALS — BP 135/82 | HR 54 | Temp 98.1°F | Ht 65.0 in | Wt 211.0 lb

## 2011-11-08 DIAGNOSIS — R7989 Other specified abnormal findings of blood chemistry: Secondary | ICD-10-CM

## 2011-11-08 DIAGNOSIS — E291 Testicular hypofunction: Secondary | ICD-10-CM

## 2011-11-08 DIAGNOSIS — T7840XA Allergy, unspecified, initial encounter: Secondary | ICD-10-CM | POA: Diagnosis not present

## 2011-11-08 DIAGNOSIS — J45909 Unspecified asthma, uncomplicated: Secondary | ICD-10-CM | POA: Diagnosis not present

## 2011-11-08 DIAGNOSIS — K219 Gastro-esophageal reflux disease without esophagitis: Secondary | ICD-10-CM

## 2011-11-08 DIAGNOSIS — I10 Essential (primary) hypertension: Secondary | ICD-10-CM | POA: Diagnosis not present

## 2011-11-08 DIAGNOSIS — G4730 Sleep apnea, unspecified: Secondary | ICD-10-CM

## 2011-11-08 DIAGNOSIS — E669 Obesity, unspecified: Secondary | ICD-10-CM

## 2011-11-08 MED ORDER — MONTELUKAST SODIUM 10 MG PO TABS: 10.0000 mg | ORAL_TABLET | Freq: Every day | ORAL | Status: DC

## 2011-11-08 MED ORDER — TESTOSTERONE CYPIONATE 200 MG/ML IM SOLN: 100.0000 mg | INTRAMUSCULAR | Status: DC

## 2011-11-08 NOTE — Patient Instructions (Addendum)
Testosterone injection What is this medicine? TESTOSTERONE (tes TOS ter one) is the main male hormone. It supports normal male development such as muscle growth, facial hair, and deep voice. It is used in males to treat low testosterone levels. This medicine may be used for other purposes; ask your health care provider or pharmacist if you have questions. What should I tell my health care provider before I take this medicine? They need to know if you have any of these conditions: -breast cancer -diabetes -heart disease -kidney disease -liver disease -lung disease -prostate cancer, enlargement -an unusual or allergic reaction to testosterone, other medicines, foods, dyes, or preservatives -pregnant or trying to get pregnant -breast-feeding How should I use this medicine? This medicine is for injection into a muscle. It is usually given by a health care professional in a hospital or clinic setting. Contact your pediatrician regarding the use of this medicine in children. While this medicine may be prescribed for children as young as 12 years of age for selected conditions, precautions do apply. Overdosage: If you think you have taken too much of this medicine contact a poison control center or emergency room at once. NOTE: This medicine is only for you. Do not share this medicine with others. What if I miss a dose? Try not to miss a dose. Your doctor or health care professional will tell you when your next injection is due. Notify the office if you are unable to keep an appointment. What may interact with this medicine? -medicines for diabetes -medicines that treat or prevent blood clots like warfarin -oxyphenbutazone -propranolol -steroid medicines like prednisone or cortisone This list may not describe all possible interactions. Give your health care provider a list of all the medicines, herbs, non-prescription drugs, or dietary supplements you use. Also tell them if you smoke, drink  alcohol, or use illegal drugs. Some items may interact with your medicine. What should I watch for while using this medicine? Visit your doctor or health care professional for regular checks on your progress. They will need to check the level of testosterone in your blood. This medicine may affect blood sugar levels. If you have diabetes, check with your doctor or health care professional before you change your diet or the dose of your diabetic medicine. This drug is banned from use in athletes by most athletic organizations. What side effects may I notice from receiving this medicine? Side effects that you should report to your doctor or health care professional as soon as possible: -allergic reactions like skin rash, itching or hives, swelling of the face, lips, or tongue -breast enlargement -breathing problems -changes in mood, especially anger, depression, or rage -dark urine -general ill feeling or flu-like symptoms -light-colored stools -loss of appetite, nausea -nausea, vomiting -right upper belly pain -stomach pain -swelling of ankles -too frequent or persistent erections -trouble passing urine or change in the amount of urine -unusually weak or tired -yellowing of the eyes or skin Additional side effects that can occur in women include: -deep or hoarse voice -facial hair growth -irregular menstrual periods Side effects that usually do not require medical attention (report to your doctor or health care professional if they continue or are bothersome): -acne -change in sex drive or performance -hair loss -headache This list may not describe all possible side effects. Call your doctor for medical advice about side effects. You may report side effects to FDA at 1-800-FDA-1088. Where should I keep my medicine? Keep out of the reach of children. This medicine   can be abused. Keep your medicine in a safe place to protect it from theft. Do not share this medicine with anyone.  Selling or giving away this medicine is dangerous and against the law. Store at room temperature between 20 and 25 degrees C (68 and 77 degrees F). Do not freeze. Protect from light. Follow the directions for the product you are prescribed. Throw away any unused medicine after the expiration date. NOTE: This sheet is a summary. It may not cover all possible information. If you have questions about this medicine, talk to your doctor, pharmacist, or health care provider.  2012, [employer]. (03/09/2007 4:13:46 PM) 

## 2011-11-09 ENCOUNTER — Ambulatory Visit: Payer: Medicare Other

## 2011-11-09 NOTE — Assessment & Plan Note (Signed)
Improved control on Bystolic, no change in dosing
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
## 2011-11-09 NOTE — Assessment & Plan Note (Signed)
Agrees to start Testosterone injections 100 mg every other week

## 2011-11-09 NOTE — Assessment & Plan Note (Signed)
Improved some on Omeprazole and Ranitidine, is awaiting appt with Gastroenterology next month

## 2011-11-09 NOTE — Assessment & Plan Note (Signed)
Previously had a bipap machine but is not currently treating his sleep apnea, he has been set up to see pulmonology to get his therapy back on track

## 2011-11-09 NOTE — Assessment & Plan Note (Signed)
Encouraged DASH diet and increased exercise 

## 2011-11-10 ENCOUNTER — Ambulatory Visit: Payer: Medicare Other

## 2011-11-14 ENCOUNTER — Ambulatory Visit (INDEPENDENT_AMBULATORY_CARE_PROVIDER_SITE_OTHER): Payer: Medicare Other

## 2011-11-14 DIAGNOSIS — E349 Endocrine disorder, unspecified: Secondary | ICD-10-CM

## 2011-11-14 DIAGNOSIS — E291 Testicular hypofunction: Secondary | ICD-10-CM | POA: Diagnosis not present

## 2011-11-14 MED ORDER — TESTOSTERONE CYPIONATE 200 MG/ML IM SOLN
100.0000 mg | INTRAMUSCULAR | Status: DC
  Administered 2011-11-14: 100 mg via INTRAMUSCULAR

## 2011-11-14 NOTE — Progress Notes (Signed)
  Subjective:    Patient ID: William Mcdonald, male    DOB: [birth year], 36 y.o.   MRN: [id number]  HPI    Review of Systems     Objective:   Physical Exam        Assessment & Plan:  Patient came in for his first testosterone injection. Patient tolerated injection well

## 2011-11-15 ENCOUNTER — Institutional Professional Consult (permissible substitution): Payer: Medicare Other | Admitting: Internal Medicine

## 2011-11-16 ENCOUNTER — Ambulatory Visit: Payer: Medicare Other | Admitting: Cardiology

## 2011-11-18 ENCOUNTER — Institutional Professional Consult (permissible substitution): Payer: Medicare Other | Admitting: Pulmonary Disease

## 2011-11-23 ENCOUNTER — Encounter: Payer: Self-pay | Admitting: Cardiology

## 2011-11-28 ENCOUNTER — Ambulatory Visit: Payer: Medicare Other

## 2011-11-29 ENCOUNTER — Ambulatory Visit: Payer: Medicare Other

## 2011-12-05 ENCOUNTER — Ambulatory Visit (INDEPENDENT_AMBULATORY_CARE_PROVIDER_SITE_OTHER): Payer: Medicare Other | Admitting: Internal Medicine

## 2011-12-05 ENCOUNTER — Encounter: Payer: Self-pay | Admitting: Internal Medicine

## 2011-12-05 ENCOUNTER — Other Ambulatory Visit (INDEPENDENT_AMBULATORY_CARE_PROVIDER_SITE_OTHER): Payer: Medicare Other

## 2011-12-05 VITALS — BP 110/84 | HR 53 | Ht 66.0 in | Wt 213.0 lb

## 2011-12-05 DIAGNOSIS — R1084 Generalized abdominal pain: Secondary | ICD-10-CM

## 2011-12-05 DIAGNOSIS — K589 Irritable bowel syndrome without diarrhea: Secondary | ICD-10-CM

## 2011-12-05 DIAGNOSIS — R197 Diarrhea, unspecified: Secondary | ICD-10-CM

## 2011-12-05 DIAGNOSIS — K219 Gastro-esophageal reflux disease without esophagitis: Secondary | ICD-10-CM

## 2011-12-05 LAB — IGA: IgA: 248 mg/dL (ref 68–378)

## 2011-12-05 MED ORDER — CILIDINIUM-CHLORDIAZEPOXIDE 2.5-5 MG PO CAPS: ORAL_CAPSULE | ORAL | Status: DC

## 2011-12-05 NOTE — Patient Instructions (Addendum)
Your physician has requested that you go to the basement for lab work before leaving today  We have sent the following medications to your pharmacy for you to pick up at your convenience:  Librax   Please follow up with Dr. Marina Goodell in 6-8 weeks
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
## 2011-12-06 ENCOUNTER — Other Ambulatory Visit: Payer: Self-pay | Admitting: Internal Medicine

## 2011-12-06 MED ORDER — DICYCLOMINE HCL 20 MG PO TABS: ORAL_TABLET | ORAL | Status: DC

## 2011-12-20 ENCOUNTER — Ambulatory Visit: Payer: Medicare Other | Admitting: Family Medicine

## 2012-01-31 ENCOUNTER — Ambulatory Visit: Payer: Medicare Other | Admitting: Internal Medicine

## 2012-02-17 ENCOUNTER — Ambulatory Visit: Payer: Medicare Other | Admitting: Family Medicine

## 2012-06-08 ENCOUNTER — Encounter: Payer: Self-pay | Admitting: Family Medicine

## 2012-06-08 ENCOUNTER — Ambulatory Visit (INDEPENDENT_AMBULATORY_CARE_PROVIDER_SITE_OTHER): Payer: Medicare Other | Admitting: Family Medicine

## 2012-06-08 VITALS — BP 127/89 | HR 80 | Temp 97.9°F | Resp 16 | Wt 211.8 lb

## 2012-06-08 DIAGNOSIS — N342 Other urethritis: Secondary | ICD-10-CM | POA: Diagnosis not present

## 2012-06-08 LAB — POCT URINALYSIS DIPSTICK
Blood, UA: NEGATIVE
Glucose, UA: NEGATIVE
Ketones, UA: NEGATIVE
Protein, UA: NEGATIVE
Spec Grav, UA: 1.025
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
## 2012-06-09 LAB — GC/CHLAMYDIA PROBE AMP
CT Probe RNA: NEGATIVE
GC Probe RNA: NEGATIVE

## 2012-06-13 ENCOUNTER — Telehealth: Payer: Self-pay | Admitting: *Deleted

## 2012-06-13 NOTE — Telephone Encounter (Signed)
Message copied by Marlene Lard on Wed Jun 13, 2012  4:56 PM ------      Message from: Jeoffrey Massed      Created: Tue Jun 12, 2012 12:18 AM       Pls notify pt that his gonorrhea and chlamydia tests were both negative.  I recommend no medication treatment for his symptoms at this time.      If worsening then have him let me know and I'll refer him to a urologist.-thx ------

## 2012-06-13 NOTE — Telephone Encounter (Addendum)
Patient given lab results. Patient stated that he is not having symptoms at the moment "they come and go." Patient wanted office to find out what inhaler insurance will pay for. Pharmacy informed him insurance did not pay for last inhaler rx. Patient also requested appointment to see Dr. Milinda Cave for depression.

## 2012-06-14 ENCOUNTER — Encounter: Payer: Self-pay | Admitting: Family Medicine

## 2012-06-14 ENCOUNTER — Ambulatory Visit (INDEPENDENT_AMBULATORY_CARE_PROVIDER_SITE_OTHER): Payer: Medicare Other | Admitting: Family Medicine

## 2012-06-14 VITALS — BP 141/96 | HR 79 | Temp 98.1°F | Resp 16 | Ht 65.5 in | Wt 205.2 lb

## 2012-06-14 DIAGNOSIS — F331 Major depressive disorder, recurrent, moderate: Secondary | ICD-10-CM | POA: Diagnosis not present

## 2012-06-14 DIAGNOSIS — F410 Panic disorder [episodic paroxysmal anxiety] without agoraphobia: Secondary | ICD-10-CM | POA: Diagnosis not present

## 2012-06-14 MED ORDER — CITALOPRAM HYDROBROMIDE 20 MG PO TABS: 20.0000 mg | ORAL_TABLET | Freq: Every day | ORAL | Status: DC
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
## 2012-06-15 ENCOUNTER — Encounter: Payer: Self-pay | Admitting: Family Medicine

## 2012-06-15 ENCOUNTER — Ambulatory Visit: Payer: Medicare Other | Admitting: Family Medicine

## 2012-06-15 NOTE — Assessment & Plan Note (Addendum)
Start citalopram.  Therapeutic expectations and side effect profile of medication discussed today.  Patient's questions answered. Discussed possible addition of counseling in near future and I asked him to think about this and we can arrange it at next f/u here. He also has GAD with panic disorder, both of which should be responsive to SSRI.   Signs/symptoms to call or return for were reviewed and pt expressed understanding. He has some focus/attention problems and I may consider adding a med for this in the future depending on how he responds to SSRI trial.

## 2012-06-18 ENCOUNTER — Encounter: Payer: Self-pay | Admitting: Family Medicine

## 2012-06-18 ENCOUNTER — Ambulatory Visit (INDEPENDENT_AMBULATORY_CARE_PROVIDER_SITE_OTHER): Payer: Medicare Other | Admitting: Family Medicine

## 2012-06-18 VITALS — BP 128/88 | HR 70 | Temp 98.1°F | Resp 14 | Wt 201.0 lb

## 2012-06-18 DIAGNOSIS — K219 Gastro-esophageal reflux disease without esophagitis: Secondary | ICD-10-CM | POA: Diagnosis not present

## 2012-06-18 DIAGNOSIS — R1310 Dysphagia, unspecified: Secondary | ICD-10-CM | POA: Diagnosis not present

## 2012-06-18 MED ORDER — ALBUTEROL SULFATE HFA 108 (90 BASE) MCG/ACT IN AERS: 2.0000 | INHALATION_SPRAY | RESPIRATORY_TRACT | Status: DC | PRN

## 2012-06-18 MED ORDER — DICYCLOMINE HCL 20 MG PO TABS: ORAL_TABLET | ORAL | Status: DC
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
## 2012-06-19 NOTE — Telephone Encounter (Signed)
Patient was given appointment and was seen 06/18/12 in office.

## 2012-06-20 ENCOUNTER — Encounter: Payer: Self-pay | Admitting: Family Medicine

## 2012-06-20 ENCOUNTER — Ambulatory Visit (INDEPENDENT_AMBULATORY_CARE_PROVIDER_SITE_OTHER): Payer: Medicare Other | Admitting: Family Medicine

## 2012-06-20 VITALS — BP 125/86 | HR 73 | Temp 98.1°F | Resp 16 | Wt 203.0 lb

## 2012-06-20 DIAGNOSIS — N23 Unspecified renal colic: Secondary | ICD-10-CM | POA: Diagnosis not present

## 2012-06-20 NOTE — Progress Notes (Signed)
OFFICE NOTE  06/20/2012  CC:  Chief Complaint  Patient presents with  . Follow-up    Pt c/o Right scrotum pain that radiates to Right flank      HPI: Patient is a 37 y.o. Caucasian male who is here for testicular pain. Several year hx of testicular pain, both sides, hx of bacterial infection at some point in the past per pt report, but he is unsure of the exact structure infected.  Has had scrotal u/s x 2 and the only finding he knows of was infection (on first u/s; second u/s normal). The pain is intermittent : seems to be present for 1-2 mo and then go away for 6-8 mo---describes more of an ache in testicles and if they are touched they are tender.  He notes no redness or swelling of the scrotum. No known trigger.  Most recently he has noted right sided testicular pain, radiating up into right groin and into right side.  Denies seeing any bulge in groin region.  Every once in a while he has a mild burning pain with urination.  No gross hematuria.  No hx of kidney stones.  Appetite good. He was here on 06/08/12 for a similar complaint, at which time a UA was normal and a GC/chlamydia urethral swab was negative.  Pertinent PMH:  Past Medical History  Diagnosis Date  . OSA (obstructive sleep apnea)     previously bipap  . Obese   . GERD (gastroesophageal reflux disease)   . IBS (irritable bowel syndrome)   . Hypertension   . Asthma   . Hyperlipidemia 10/27/2011  . Low testosterone 10/27/2011  Depression  MEDS:  Outpatient Prescriptions Prior to Visit  Medication Sig Dispense Refill  . albuterol (PROAIR HFA) 108 (90 BASE) MCG/ACT inhaler Inhale 2 puffs into the lungs every 4 (four) hours as needed for wheezing.  1 Inhaler  1  . cetirizine (ZYRTEC) 10 MG tablet Take 10 mg by mouth daily as needed.      . citalopram (CELEXA) 20 MG tablet Take 1 tablet (20 mg total) by mouth daily.  30 tablet  1  . clidinium-chlordiazePOXIDE (LIBRAX) 2.5-5 MG per capsule Take one capsule before meals  as needed  100 capsule  2  . dicyclomine (BENTYL) 20 MG tablet Take 1 tablet before meals as needed.  30 tablet  2  . montelukast (SINGULAIR) 10 MG tablet Take 1 tablet (10 mg total) by mouth at bedtime.  30 tablet  3  . nebivolol (BYSTOLIC) 10 MG tablet Take 0.5 tablets (5 mg total) by mouth daily.  21 tablet  0  . Omeprazole 20 MG TBEC Take 1 tablet (20 mg total) by mouth daily.  30 each  5  . ranitidine (ZANTAC) 300 MG tablet Take 1 tablet (300 mg total) by mouth at bedtime.  30 tablet  3   No facility-administered medications prior to visit.    PE: Blood pressure 125/86, pulse 73, temperature 98.1 F (36.7 C), temperature source Oral, resp. rate 16, weight 203 lb (92.08 kg), SpO2 97.00%. Gen: Alert, well appearing.  Patient is oriented to person, place, time, and situation. ABD: soft, NT, ND, BS normal.  No hepatospenomegaly or mass.  No bruits. GU: Genitals normal; both testes normal without tenderness, masses, hydroceles, varicoceles, erythema or swelling. Shaft normal, circumcised, meatus normal without discharge. No inguinal hernia noted. No inguinal lymphadenopathy.  LAB : none today  IMPRESSION AND PLAN:  Genitourinary pain Nonspecific.  No clear abnormality found to explain  his symptoms. Sx's seem to come and go but have been bothering him for some time now. Will ask urology to see him for further evaluation and management.   An After Visit Summary was printed and given to the patient.  FOLLOW UP: prn

## 2012-06-21 DIAGNOSIS — IMO0002 Reserved for concepts with insufficient information to code with codable children: Secondary | ICD-10-CM | POA: Insufficient documentation

## 2012-06-21 NOTE — Assessment & Plan Note (Signed)
Nonspecific.  No clear abnormality found to explain his symptoms. Sx's seem to come and go but have been bothering him for some time now. Will ask urology to see him for further evaluation and management.

## 2012-06-28 ENCOUNTER — Telehealth: Payer: Self-pay | Admitting: Family Medicine

## 2012-07-08 ENCOUNTER — Encounter: Payer: Self-pay | Admitting: Family Medicine

## 2012-07-08 NOTE — Assessment & Plan Note (Addendum)
GERD, with dysphagia.  Some PND may be contributing as well. Emphasized GERD diet and behav changes, restart daily PPI and be compliant (omeprazole). GI referral ordered today for consideration of upper endoscopy.

## 2012-07-09 ENCOUNTER — Ambulatory Visit: Payer: Medicare Other | Admitting: Internal Medicine

## 2012-07-16 ENCOUNTER — Ambulatory Visit: Payer: Medicare Other | Admitting: Family Medicine

## 2012-09-07 ENCOUNTER — Ambulatory Visit: Payer: Medicare Other | Admitting: Family Medicine

## 2013-06-28 NOTE — Telephone Encounter (Signed)
done

## 2013-07-31 ENCOUNTER — Encounter: Payer: Self-pay | Admitting: Family Medicine

## 2013-07-31 ENCOUNTER — Ambulatory Visit (INDEPENDENT_AMBULATORY_CARE_PROVIDER_SITE_OTHER): Payer: Medicare Other | Admitting: Family Medicine

## 2013-07-31 VITALS — BP 138/94 | HR 54 | Temp 98.9°F | Resp 18 | Ht 65.5 in | Wt 209.0 lb

## 2013-07-31 DIAGNOSIS — R300 Dysuria: Secondary | ICD-10-CM | POA: Diagnosis not present

## 2013-07-31 DIAGNOSIS — N341 Nonspecific urethritis: Secondary | ICD-10-CM

## 2013-07-31 LAB — POCT URINALYSIS DIPSTICK
BILIRUBIN UA: NEGATIVE
Glucose, UA: NEGATIVE
LEUKOCYTES UA: NEGATIVE
Nitrite, UA: NEGATIVE
PH UA: 5.5
RBC UA: NEGATIVE
Urobilinogen, UA: 1

## 2013-07-31 MED ORDER — METRONIDAZOLE 500 MG PO TABS: ORAL_TABLET | ORAL | Status: DC

## 2013-07-31 MED ORDER — PREDNISONE 20 MG PO TABS: ORAL_TABLET | ORAL | Status: DC

## 2013-07-31 NOTE — Progress Notes (Signed)
Pre visit review using our clinic review tool, if applicable. No additional management support is needed unless otherwise documented below in the visit note. 
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
## 2013-08-01 LAB — GC/CHLAMYDIA PROBE AMP, URINE
CHLAMYDIA, SWAB/URINE, PCR: NEGATIVE
GC Probe Amp, Urine: NEGATIVE

## 2014-05-28 DIAGNOSIS — R1012 Left upper quadrant pain: Secondary | ICD-10-CM | POA: Diagnosis not present

## 2014-05-28 DIAGNOSIS — I10 Essential (primary) hypertension: Secondary | ICD-10-CM | POA: Diagnosis not present

## 2014-05-28 DIAGNOSIS — K3580 Unspecified acute appendicitis: Secondary | ICD-10-CM | POA: Diagnosis not present

## 2014-05-28 DIAGNOSIS — R112 Nausea with vomiting, unspecified: Secondary | ICD-10-CM | POA: Diagnosis not present

## 2014-05-28 DIAGNOSIS — D72829 Elevated white blood cell count, unspecified: Secondary | ICD-10-CM | POA: Diagnosis not present

## 2014-05-28 DIAGNOSIS — R109 Unspecified abdominal pain: Secondary | ICD-10-CM | POA: Diagnosis not present

## 2014-05-28 DIAGNOSIS — K352 Acute appendicitis with generalized peritonitis: Secondary | ICD-10-CM | POA: Diagnosis not present

## 2014-05-28 DIAGNOSIS — R140 Abdominal distension (gaseous): Secondary | ICD-10-CM | POA: Diagnosis not present

## 2014-05-29 DIAGNOSIS — Z049 Encounter for examination and observation for unspecified reason: Secondary | ICD-10-CM | POA: Diagnosis not present

## 2014-05-29 DIAGNOSIS — R100 Acute abdomen: Secondary | ICD-10-CM | POA: Diagnosis not present

## 2014-05-29 DIAGNOSIS — D72829 Elevated white blood cell count, unspecified: Secondary | ICD-10-CM | POA: Diagnosis not present

## 2014-05-29 DIAGNOSIS — K353 Acute appendicitis with localized peritonitis: Secondary | ICD-10-CM | POA: Diagnosis not present

## 2014-05-29 DIAGNOSIS — K3580 Unspecified acute appendicitis: Secondary | ICD-10-CM | POA: Diagnosis not present

## 2014-05-29 DIAGNOSIS — E876 Hypokalemia: Secondary | ICD-10-CM | POA: Diagnosis not present

## 2014-06-18 DIAGNOSIS — J029 Acute pharyngitis, unspecified: Secondary | ICD-10-CM | POA: Diagnosis not present

## 2014-06-18 DIAGNOSIS — J0100 Acute maxillary sinusitis, unspecified: Secondary | ICD-10-CM | POA: Diagnosis not present

## 2014-06-18 DIAGNOSIS — S80861A Insect bite (nonvenomous), right lower leg, initial encounter: Secondary | ICD-10-CM | POA: Diagnosis not present

## 2014-06-18 DIAGNOSIS — M791 Myalgia: Secondary | ICD-10-CM | POA: Diagnosis not present

## 2014-06-18 DIAGNOSIS — R51 Headache: Secondary | ICD-10-CM | POA: Diagnosis not present

## 2015-04-29 ENCOUNTER — Ambulatory Visit: Payer: Medicare Other | Admitting: Family Medicine

## 2015-04-29 DIAGNOSIS — Z0289 Encounter for other administrative examinations: Secondary | ICD-10-CM

## 2015-09-28 ENCOUNTER — Encounter: Payer: Self-pay | Admitting: Family Medicine

## 2015-09-28 ENCOUNTER — Ambulatory Visit (INDEPENDENT_AMBULATORY_CARE_PROVIDER_SITE_OTHER): Payer: Medicare Other | Admitting: Family Medicine

## 2015-09-28 VITALS — BP 128/89 | HR 66 | Temp 97.9°F | Resp 18 | Wt 210.0 lb

## 2015-09-28 DIAGNOSIS — M79671 Pain in right foot: Secondary | ICD-10-CM

## 2015-09-28 DIAGNOSIS — F411 Generalized anxiety disorder: Secondary | ICD-10-CM

## 2015-09-28 DIAGNOSIS — M79672 Pain in left foot: Secondary | ICD-10-CM | POA: Diagnosis not present

## 2015-09-28 DIAGNOSIS — G4733 Obstructive sleep apnea (adult) (pediatric): Secondary | ICD-10-CM

## 2015-09-28 MED ORDER — CITALOPRAM HYDROBROMIDE 20 MG PO TABS: 20.0000 mg | ORAL_TABLET | Freq: Every day | ORAL | 1 refills | Status: DC

## 2015-09-28 NOTE — Progress Notes (Signed)
Pre visit review using our clinic review tool, if applicable. No additional management support is needed unless otherwise documented below in the visit note. 
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
## 2015-10-02 ENCOUNTER — Encounter: Payer: Self-pay | Admitting: Family Medicine

## 2015-10-08 ENCOUNTER — Ambulatory Visit: Payer: Medicare Other | Admitting: Podiatry

## 2015-10-28 ENCOUNTER — Ambulatory Visit: Payer: Medicare Other | Admitting: Family Medicine

## 2015-11-17 ENCOUNTER — Institutional Professional Consult (permissible substitution): Payer: Medicare Other | Admitting: Pulmonary Disease

## 2016-02-05 ENCOUNTER — Ambulatory Visit: Payer: Medicare Other | Admitting: Family Medicine

## 2016-02-05 ENCOUNTER — Encounter: Payer: Self-pay | Admitting: Family Medicine

## 2016-02-08 ENCOUNTER — Ambulatory Visit: Payer: Medicare Other | Admitting: Family Medicine
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
## 2016-02-11 ENCOUNTER — Ambulatory Visit (INDEPENDENT_AMBULATORY_CARE_PROVIDER_SITE_OTHER): Payer: Medicare Other | Admitting: Family Medicine

## 2016-02-11 ENCOUNTER — Encounter: Payer: Self-pay | Admitting: Family Medicine

## 2016-02-11 VITALS — BP 148/74 | HR 61 | Temp 97.6°F | Resp 16 | Wt 216.0 lb

## 2016-02-11 DIAGNOSIS — S29012A Strain of muscle and tendon of back wall of thorax, initial encounter: Secondary | ICD-10-CM | POA: Diagnosis not present

## 2016-02-11 DIAGNOSIS — S39012A Strain of muscle, fascia and tendon of lower back, initial encounter: Secondary | ICD-10-CM

## 2016-02-11 NOTE — Patient Instructions (Signed)
Take 3 otc generic ibuprofen twice daily with food for 10 days.
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
## 2016-03-02 ENCOUNTER — Encounter: Payer: Self-pay | Admitting: Family Medicine

## 2016-05-09 ENCOUNTER — Other Ambulatory Visit: Payer: Self-pay | Admitting: Family Medicine

## 2016-05-10 NOTE — Telephone Encounter (Signed)
Patient notified and stated that he would call back to schedule an appointment. He is driving and will have to look at his calendar.

## 2016-05-10 NOTE — Telephone Encounter (Signed)
I will rx a 30 d supply w/out RFs.  Pt needs to make appt for follow up of his chronic anxiety.-thx

## 2016-11-09 ENCOUNTER — Encounter: Payer: Self-pay | Admitting: Family Medicine

## 2016-11-09 ENCOUNTER — Ambulatory Visit (INDEPENDENT_AMBULATORY_CARE_PROVIDER_SITE_OTHER): Payer: Medicare Other | Admitting: Family Medicine

## 2016-11-09 VITALS — BP 137/89 | HR 71 | Temp 98.6°F | Resp 16 | Ht 65.5 in | Wt 203.0 lb

## 2016-11-09 DIAGNOSIS — F339 Major depressive disorder, recurrent, unspecified: Secondary | ICD-10-CM | POA: Diagnosis not present

## 2016-11-09 DIAGNOSIS — F331 Major depressive disorder, recurrent, moderate: Secondary | ICD-10-CM

## 2016-11-09 MED ORDER — CITALOPRAM HYDROBROMIDE 40 MG PO TABS: 40.0000 mg | ORAL_TABLET | Freq: Every day | ORAL | 0 refills | Status: DC

## 2016-11-09 NOTE — Patient Instructions (Signed)
Split the citalopram 40mg  tab in half with a pill splitter and take 1/2 tab once a day for 2 weeks, then start taking a whole tab once daily.
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
## 2016-11-11 ENCOUNTER — Ambulatory Visit: Payer: Self-pay | Admitting: Family Medicine

## 2016-12-07 ENCOUNTER — Ambulatory Visit: Payer: Self-pay | Admitting: Clinical

## 2016-12-09 ENCOUNTER — Ambulatory Visit: Payer: Medicare Other | Admitting: Family Medicine

## 2016-12-17 ENCOUNTER — Other Ambulatory Visit: Payer: Self-pay | Admitting: Family Medicine

## 2017-01-07 DIAGNOSIS — R079 Chest pain, unspecified: Secondary | ICD-10-CM | POA: Diagnosis not present

## 2017-01-07 DIAGNOSIS — Z049 Encounter for examination and observation for unspecified reason: Secondary | ICD-10-CM | POA: Diagnosis not present

## 2017-01-07 DIAGNOSIS — R2681 Unsteadiness on feet: Secondary | ICD-10-CM | POA: Diagnosis not present

## 2017-01-07 DIAGNOSIS — R110 Nausea: Secondary | ICD-10-CM | POA: Diagnosis not present

## 2017-01-07 DIAGNOSIS — G4733 Obstructive sleep apnea (adult) (pediatric): Secondary | ICD-10-CM | POA: Diagnosis not present

## 2017-01-07 DIAGNOSIS — R1084 Generalized abdominal pain: Secondary | ICD-10-CM | POA: Diagnosis not present

## 2017-01-07 DIAGNOSIS — R4182 Altered mental status, unspecified: Secondary | ICD-10-CM | POA: Diagnosis not present

## 2017-01-07 DIAGNOSIS — R51 Headache: Secondary | ICD-10-CM | POA: Diagnosis not present

## 2017-01-07 DIAGNOSIS — R55 Syncope and collapse: Secondary | ICD-10-CM | POA: Diagnosis not present

## 2017-01-07 DIAGNOSIS — D72829 Elevated white blood cell count, unspecified: Secondary | ICD-10-CM | POA: Diagnosis not present

## 2017-01-08 DIAGNOSIS — R079 Chest pain, unspecified: Secondary | ICD-10-CM | POA: Diagnosis not present

## 2017-01-08 DIAGNOSIS — D72829 Elevated white blood cell count, unspecified: Secondary | ICD-10-CM | POA: Diagnosis not present

## 2017-01-08 DIAGNOSIS — R51 Headache: Secondary | ICD-10-CM | POA: Diagnosis not present

## 2017-01-08 DIAGNOSIS — R4182 Altered mental status, unspecified: Secondary | ICD-10-CM | POA: Diagnosis not present

## 2017-01-13 ENCOUNTER — Other Ambulatory Visit: Payer: Self-pay | Admitting: Family Medicine

## 2017-01-13 MED ORDER — CITALOPRAM HYDROBROMIDE 40 MG PO TABS: 40.0000 mg | ORAL_TABLET | Freq: Every day | ORAL | 0 refills | Status: DC

## 2017-01-23 ENCOUNTER — Ambulatory Visit: Payer: Medicare Other | Admitting: Family Medicine

## 2017-01-25 ENCOUNTER — Ambulatory Visit (INDEPENDENT_AMBULATORY_CARE_PROVIDER_SITE_OTHER): Payer: Medicare Other | Admitting: Family Medicine

## 2017-01-25 ENCOUNTER — Encounter: Payer: Self-pay | Admitting: Family Medicine

## 2017-01-25 VITALS — BP 134/73 | HR 63 | Temp 98.7°F | Resp 16 | Ht 65.5 in | Wt 205.5 lb

## 2017-01-25 DIAGNOSIS — R0789 Other chest pain: Secondary | ICD-10-CM

## 2017-01-25 DIAGNOSIS — R002 Palpitations: Secondary | ICD-10-CM

## 2017-01-25 DIAGNOSIS — F3341 Major depressive disorder, recurrent, in partial remission: Secondary | ICD-10-CM | POA: Diagnosis not present

## 2017-01-25 DIAGNOSIS — R1310 Dysphagia, unspecified: Secondary | ICD-10-CM | POA: Diagnosis not present

## 2017-01-25 DIAGNOSIS — K219 Gastro-esophageal reflux disease without esophagitis: Secondary | ICD-10-CM | POA: Diagnosis not present

## 2017-01-25 MED ORDER — CITALOPRAM HYDROBROMIDE 40 MG PO TABS: 40.0000 mg | ORAL_TABLET | Freq: Every day | ORAL | 6 refills | Status: DC

## 2017-01-25 MED ORDER — PANTOPRAZOLE SODIUM 40 MG PO TBEC: 40.0000 mg | DELAYED_RELEASE_TABLET | Freq: Every day | ORAL | 6 refills | Status: DC
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
## 2017-02-14 ENCOUNTER — Telehealth: Payer: Self-pay | Admitting: Family Medicine

## 2017-02-14 ENCOUNTER — Ambulatory Visit: Payer: Self-pay

## 2017-02-14 NOTE — Telephone Encounter (Signed)
Left message on phone for pt : Dr Milinda CaveMcGowen is out of the office this afternoon, discontinue going to gym until further notice and of course, go to ER if symptoms persist or worsen.    Dr Milinda CaveMcGowen, Please advise.

## 2017-02-14 NOTE — Telephone Encounter (Signed)
Copied from CRM [phone]. Topic: Quick Communication - Rx Refill/Question >> Feb 14, 2017 11:53 AM Louie BunPalacios Medina, Rosey Batheresa D wrote: Medication:albuterol   Has the patient contacted their pharmacy? Yes but he stopped taking it and would like to start back using it.   (Agent: If no, request that the patient contact the pharmacy for the refill.)   Preferred Pharmacy (with phone number or street name):[facility] [address] - [place], [place] - [place]   Agent: Please be advised that RX refills may take up to 3 business days. We ask that you follow-up with your pharmacy.

## 2017-02-14 NOTE — Telephone Encounter (Signed)
Called pt. to check if he rec'd the voice message by Dr. Samul DadaMcGowen's CMA.  Stated he did receive the message.  Advised to call back if worsening of symptoms, and to await further recommendations per Dr. Milinda CaveMcGowen tomorrow.  Pt. verb. understanding.

## 2017-02-14 NOTE — Telephone Encounter (Signed)
See Triage note of 02/14/17

## 2017-02-14 NOTE — Telephone Encounter (Signed)
Call made to pt. to discuss his request for Albuterol Inhaler.  Related hx of asthma. Stated he was on Albuterol Inhaler in the past.  Reported he has started working out at [employer], and has noticed increased shortness of breath and mid-upper chest tightness, with walking or running on the treadmill.  Stated the breathing and chest tightness felt mild.  Denied any radiation of the chest tightness into neck, jaw, left shoulder, or left arm.  Denied any chest discomfort at present time.  Reported he has not experienced this with other activities.  Stated he noticed small amt. of wheezing during work-out last week. Reported the symptoms subside after resting for about 5-10 minutes. Questioned about other upper resp. symptoms.  Stated he has a lot of clear nasal drainage. Denied coughing, however, reported when he eats too fast, he has gone into a coughing episode.  Reported he is being referred to ENT, due to 80-90 % blockage of one side of nose.  Stated he feels this is what contributes to his shortness of breath with exercise.  Advised will send this note to Dr. Milinda CaveMcGowen.  Pt. agreed with plan.  Encouraged to call office back if symptoms worsen.  Verb. Understanding.       Answer Assessment - Initial Assessment Questions 1. RESPIRATORY STATUS: "Describe your breathing?" (e.g., wheezing, shortness of breath, unable to speak, severe coughing)     Recently started working out, and notices shortness of breath with running or walking on treadmill   2. ONSET: "When did this breathing problem begin?"      Last week  3. PATTERN "Does the difficult breathing come and go, or has it been constant since it started?"      Occurs with exercising at the gym; denies any other episodes outside of the exercise.  4. SEVERITY: "How bad is your breathing?" (e.g., mild, moderate, severe)    - MILD: No SOB at rest, mild SOB with walking, speaks normally in sentences, can lay down, no retractions, pulse < 100.    - MODERATE: SOB  at rest, SOB with minimal exertion and prefers to sit, cannot lie down flat, speaks in phrases, mild retractions, audible wheezing, pulse 100-120.    - SEVERE: Very SOB at rest, speaks in single words, struggling to breathe, sitting hunched forward, retractions, pulse > 120      "mild" 5. RECURRENT SYMPTOM: "Have you had difficulty breathing before?" If so, ask: "When was the last time?" and "What happened that time?"     Stated it feels tight when I work out; "like I can't get my breath" 6. CARDIAC HISTORY: "Do you have any history of heart disease?" (e.g., heart attack, angina, bypass surgery, angioplasty)      Denies any hx of heart problem  7. LUNG HISTORY: "Do you have any history of lung disease?"  (e.g., pulmonary embolus, asthma, emphysema)     Only asthma; has been a long time since asthma flare-up 8. CAUSE: "What do you think is causing the breathing problem?"      Hx of asthma. 9. OTHER SYMPTOMS: "Do you have any other symptoms? (e.g., dizziness, runny nose, cough, chest pain, fever)     Describes chest tightness mid chest and upper chest ; eases up with resting 5-10 minutes; chest tightness improves/ resolves when breathing returns to normal.  10. PREGNANCY: "Is there any chance you are pregnant?" "When was your last menstrual period?"       N/a 11. TRAVEL: "Have you traveled out  of the country in the last month?" (e.g., travel history, exposures)       no  Protocols used: BREATHING DIFFICULTY-A-AH Copied from CRM [id number]. Topic: Quick Communication - Rx Refill/Question >> Feb 14, 2017 11:53 AM Louie Bun, Rosey Bath D wrote: Medication:albuterol   Has the patient contacted their pharmacy? Yes but he stopped taking it and would like to start back using it.   (Agent: If no, request that the patient contact the pharmacy for the refill.)   Preferred Pharmacy (with phone number or street name):[facility] [address] [place] - [place] [address]   Agent: Please be  advised that RX refills may take up to 3 business days. We ask that you follow-up with your pharmacy.

## 2017-02-15 ENCOUNTER — Other Ambulatory Visit: Payer: Self-pay | Admitting: Family Medicine

## 2017-02-15 MED ORDER — ALBUTEROL SULFATE HFA 108 (90 BASE) MCG/ACT IN AERS: 2.0000 | INHALATION_SPRAY | Freq: Four times a day (QID) | RESPIRATORY_TRACT | 0 refills | Status: DC | PRN

## 2017-02-15 NOTE — Telephone Encounter (Signed)
Left detailed message on pt's cell.  Okay per DPR. 

## 2017-02-15 NOTE — Telephone Encounter (Signed)
I'll eRx an albuterol inhaler. Pt to make o/v if use of this does not help. He should take 2 puffs about 15-30 min before exercising.-thx

## 2017-02-18 ENCOUNTER — Emergency Department (HOSPITAL_COMMUNITY)
Admission: EM | Admit: 2017-02-18 | Discharge: 2017-02-18 | Disposition: A | Discharge status: Home / Self Care | Payer: Medicare Other | Attending: Emergency Medicine | Admitting: Emergency Medicine

## 2017-02-18 ENCOUNTER — Encounter (HOSPITAL_COMMUNITY): Payer: Self-pay

## 2017-02-18 ENCOUNTER — Emergency Department (HOSPITAL_COMMUNITY): Payer: Medicare Other

## 2017-02-18 DIAGNOSIS — J45909 Unspecified asthma, uncomplicated: Secondary | ICD-10-CM | POA: Diagnosis not present

## 2017-02-18 DIAGNOSIS — Z23 Encounter for immunization: Secondary | ICD-10-CM | POA: Insufficient documentation

## 2017-02-18 DIAGNOSIS — Y929 Unspecified place or not applicable: Secondary | ICD-10-CM | POA: Insufficient documentation

## 2017-02-18 DIAGNOSIS — Y9389 Activity, other specified: Secondary | ICD-10-CM | POA: Insufficient documentation

## 2017-02-18 DIAGNOSIS — I10 Essential (primary) hypertension: Secondary | ICD-10-CM | POA: Insufficient documentation

## 2017-02-18 DIAGNOSIS — R600 Localized edema: Secondary | ICD-10-CM | POA: Insufficient documentation

## 2017-02-18 DIAGNOSIS — Y998 Other external cause status: Secondary | ICD-10-CM | POA: Diagnosis not present

## 2017-02-18 DIAGNOSIS — S81012A Laceration without foreign body, left knee, initial encounter: Secondary | ICD-10-CM | POA: Insufficient documentation

## 2017-02-18 DIAGNOSIS — W228XXA Striking against or struck by other objects, initial encounter: Secondary | ICD-10-CM | POA: Diagnosis not present

## 2017-02-18 DIAGNOSIS — S81011A Laceration without foreign body, right knee, initial encounter: Secondary | ICD-10-CM | POA: Diagnosis not present

## 2017-02-18 DIAGNOSIS — S7011XA Contusion of right thigh, initial encounter: Secondary | ICD-10-CM | POA: Diagnosis not present

## 2017-02-18 DIAGNOSIS — Z79899 Other long term (current) drug therapy: Secondary | ICD-10-CM | POA: Diagnosis not present

## 2017-02-18 MED ORDER — POVIDONE-IODINE 10 % EX SOLN
CUTANEOUS | Status: AC
Start: 2017-02-18 — End: 2017-02-18
  Administered 2017-02-18: 1
  Filled 2017-02-18: qty 15

## 2017-02-18 MED ORDER — LIDOCAINE-EPINEPHRINE (PF) 1 %-1:200000 IJ SOLN
20.0000 mL | Freq: Once | INTRAMUSCULAR | Status: AC
  Administered 2017-02-18: 20 mL
  Filled 2017-02-18: qty 30

## 2017-02-18 MED ORDER — TETANUS-DIPHTH-ACELL PERTUSSIS 5-2.5-18.5 LF-MCG/0.5 IM SUSP
0.5000 mL | Freq: Once | INTRAMUSCULAR | Status: AC
  Administered 2017-02-18: 0.5 mL via INTRAMUSCULAR
  Filled 2017-02-18: qty 0.5
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
## 2017-02-18 NOTE — Discharge Instructions (Signed)
Follow up in 1 week for suture removal. Return to the ED if you develop new or worsening symptoms. °

## 2017-02-18 NOTE — ED Triage Notes (Signed)
Pt dropped a brake rotor on both of his legs, has bruising to the inside of right upper thigh and laceration to left knee.  [facility] dressing applied and in place with bleeding controlled

## 2017-02-21 ENCOUNTER — Telehealth: Payer: Self-pay | Admitting: Family Medicine

## 2017-02-21 NOTE — Telephone Encounter (Signed)
Sw pt and advised him to speak with his pharmacist to see if they can offer him a more affordable alternative. He will call back if they are able to find an inhaler he can afford.

## 2017-02-21 NOTE — Telephone Encounter (Signed)
Copied from CRM [phone]. Topic: Quick Communication - See Telephone Encounter >> Feb 21, 2017 10:59 AM Windy KalataMichael, Khyan Oats L, NT wrote: CRM for notification. See Telephone encounter for:  02/21/17.  Pt currently does not have insurance and the albuterol is too expensive. Is there another [facility]? Please advise.

## 2017-02-28 ENCOUNTER — Encounter (HOSPITAL_COMMUNITY): Payer: Self-pay | Admitting: *Deleted

## 2017-02-28 ENCOUNTER — Observation Stay (HOSPITAL_COMMUNITY)
Admission: EM | Admit: 2017-02-28 | Discharge: 2017-03-02 | Disposition: A | Discharge status: Home / Self Care | Payer: Medicare Other | Attending: Internal Medicine | Admitting: Internal Medicine

## 2017-02-28 ENCOUNTER — Emergency Department (HOSPITAL_COMMUNITY): Payer: Medicare Other

## 2017-02-28 DIAGNOSIS — G4733 Obstructive sleep apnea (adult) (pediatric): Secondary | ICD-10-CM

## 2017-02-28 DIAGNOSIS — S069X9A Unspecified intracranial injury with loss of consciousness of unspecified duration, initial encounter: Secondary | ICD-10-CM | POA: Diagnosis not present

## 2017-02-28 DIAGNOSIS — S0990XA Unspecified injury of head, initial encounter: Secondary | ICD-10-CM | POA: Diagnosis not present

## 2017-02-28 DIAGNOSIS — I10 Essential (primary) hypertension: Secondary | ICD-10-CM | POA: Insufficient documentation

## 2017-02-28 DIAGNOSIS — Z79899 Other long term (current) drug therapy: Secondary | ICD-10-CM | POA: Insufficient documentation

## 2017-02-28 DIAGNOSIS — R079 Chest pain, unspecified: Secondary | ICD-10-CM

## 2017-02-28 DIAGNOSIS — K219 Gastro-esophageal reflux disease without esophagitis: Secondary | ICD-10-CM | POA: Insufficient documentation

## 2017-02-28 DIAGNOSIS — Z590 Homelessness: Secondary | ICD-10-CM | POA: Insufficient documentation

## 2017-02-28 DIAGNOSIS — J45909 Unspecified asthma, uncomplicated: Secondary | ICD-10-CM | POA: Insufficient documentation

## 2017-02-28 DIAGNOSIS — W1830XA Fall on same level, unspecified, initial encounter: Secondary | ICD-10-CM | POA: Diagnosis not present

## 2017-02-28 DIAGNOSIS — R55 Syncope and collapse: Secondary | ICD-10-CM | POA: Diagnosis not present

## 2017-02-28 DIAGNOSIS — R0602 Shortness of breath: Secondary | ICD-10-CM | POA: Diagnosis not present

## 2017-02-28 DIAGNOSIS — R4182 Altered mental status, unspecified: Secondary | ICD-10-CM | POA: Diagnosis not present

## 2017-02-28 DIAGNOSIS — Z049 Encounter for examination and observation for unspecified reason: Secondary | ICD-10-CM | POA: Diagnosis not present

## 2017-02-28 DIAGNOSIS — I6529 Occlusion and stenosis of unspecified carotid artery: Secondary | ICD-10-CM | POA: Insufficient documentation

## 2017-02-28 DIAGNOSIS — R0789 Other chest pain: Secondary | ICD-10-CM | POA: Diagnosis not present

## 2017-02-28 DIAGNOSIS — R5383 Other fatigue: Secondary | ICD-10-CM | POA: Diagnosis not present

## 2017-02-28 NOTE — ED Triage Notes (Signed)
Pt fell and hit head on ground x 2 today with LOC.  Family witnessed fall [facility]. CP en route. No asa given .

## 2017-02-28 NOTE — ED Notes (Signed)
Pt with cp after first fall, pt admits to having cp everyday for couple of months.  Denies seeing anyone for it.

## 2017-03-01 ENCOUNTER — Encounter (HOSPITAL_COMMUNITY): Payer: Self-pay | Admitting: Internal Medicine

## 2017-03-01 ENCOUNTER — Emergency Department (HOSPITAL_COMMUNITY): Payer: Medicare Other

## 2017-03-01 ENCOUNTER — Observation Stay (HOSPITAL_BASED_OUTPATIENT_CLINIC_OR_DEPARTMENT_OTHER): Payer: Medicare Other

## 2017-03-01 ENCOUNTER — Observation Stay (HOSPITAL_COMMUNITY): Payer: Medicare Other

## 2017-03-01 ENCOUNTER — Other Ambulatory Visit: Payer: Self-pay

## 2017-03-01 ENCOUNTER — Other Ambulatory Visit: Payer: Self-pay | Admitting: *Deleted

## 2017-03-01 DIAGNOSIS — R079 Chest pain, unspecified: Secondary | ICD-10-CM

## 2017-03-01 DIAGNOSIS — R4182 Altered mental status, unspecified: Secondary | ICD-10-CM | POA: Diagnosis not present

## 2017-03-01 DIAGNOSIS — Z8709 Personal history of other diseases of the respiratory system: Secondary | ICD-10-CM

## 2017-03-01 DIAGNOSIS — J4520 Mild intermittent asthma, uncomplicated: Secondary | ICD-10-CM

## 2017-03-01 DIAGNOSIS — G4733 Obstructive sleep apnea (adult) (pediatric): Secondary | ICD-10-CM

## 2017-03-01 DIAGNOSIS — Z8679 Personal history of other diseases of the circulatory system: Secondary | ICD-10-CM | POA: Diagnosis not present

## 2017-03-01 DIAGNOSIS — R55 Syncope and collapse: Secondary | ICD-10-CM

## 2017-03-01 DIAGNOSIS — E669 Obesity, unspecified: Secondary | ICD-10-CM

## 2017-03-01 DIAGNOSIS — I6523 Occlusion and stenosis of bilateral carotid arteries: Secondary | ICD-10-CM | POA: Diagnosis not present

## 2017-03-01 DIAGNOSIS — K219 Gastro-esophageal reflux disease without esophagitis: Secondary | ICD-10-CM

## 2017-03-01 DIAGNOSIS — S069X9A Unspecified intracranial injury with loss of consciousness of unspecified duration, initial encounter: Secondary | ICD-10-CM | POA: Diagnosis not present

## 2017-03-01 LAB — CBC WITH DIFFERENTIAL/PLATELET
BASOS ABS: 0 10*3/uL (ref 0.0–0.1)
BASOS PCT: 0 %
EOS ABS: 0.1 10*3/uL (ref 0.0–0.7)
Eosinophils Relative: 1 %
HCT: 48.3 % (ref 39.0–52.0)
HEMOGLOBIN: 15.8 g/dL (ref 13.0–17.0)
Lymphocytes Relative: 16 %
Lymphs Abs: 1.9 10*3/uL (ref 0.7–4.0)
MCH: 29.7 pg (ref 26.0–34.0)
MCHC: 32.7 g/dL (ref 30.0–36.0)
MCV: 90.8 fL (ref 78.0–100.0)
MONOS PCT: 11 %
Monocytes Absolute: 1.3 10*3/uL — ABNORMAL HIGH (ref 0.1–1.0)
NEUTROS ABS: 8.6 10*3/uL — AB (ref 1.7–7.7)
NEUTROS PCT: 72 %
Platelets: 289 10*3/uL (ref 150–400)
RBC: 5.32 MIL/uL (ref 4.22–5.81)
RDW: 13 % (ref 11.5–15.5)
WBC: 11.9 10*3/uL — AB (ref 4.0–10.5)

## 2017-03-01 LAB — BASIC METABOLIC PANEL
ANION GAP: 8 (ref 5–15)
Anion gap: 9 (ref 5–15)
BUN: 15 mg/dL (ref 6–20)
BUN: 16 mg/dL (ref 6–20)
CALCIUM: 8.9 mg/dL (ref 8.9–10.3)
CALCIUM: 8.4 mg/dL — AB (ref 8.9–10.3)
CHLORIDE: 108 mmol/L (ref 101–111)
CO2: 26 mmol/L (ref 22–32)
CO2: 22 mmol/L (ref 22–32)
CREATININE: 0.88 mg/dL (ref 0.61–1.24)
CREATININE: 0.9 mg/dL (ref 0.61–1.24)
Chloride: 108 mmol/L (ref 101–111)
GFR calc Af Amer: >60 mL/min (ref >60)
GFR calc non Af Amer: >60 mL/min (ref >60)
Glucose, Bld: 94 mg/dL (ref 65–99)
Glucose, Bld: 115 mg/dL — ABNORMAL HIGH (ref 65–99)
Potassium: 3.4 mmol/L — ABNORMAL LOW (ref 3.5–5.1)
Potassium: 4.1 mmol/L (ref 3.5–5.1)
SODIUM: 142 mmol/L (ref 135–145)
SODIUM: 139 mmol/L (ref 135–145)

## 2017-03-01 LAB — URINALYSIS, ROUTINE W REFLEX MICROSCOPIC
BILIRUBIN URINE: NEGATIVE
Glucose, UA: NEGATIVE mg/dL
Hgb urine dipstick: NEGATIVE
KETONES UR: 5 mg/dL — AB
LEUKOCYTES UA: NEGATIVE
NITRITE: NEGATIVE
Protein, ur: NEGATIVE mg/dL
Specific Gravity, Urine: 1.012 (ref 1.005–1.030)
pH: 6 (ref 5.0–8.0)

## 2017-03-01 LAB — RAPID URINE DRUG SCREEN, HOSP PERFORMED
Amphetamines: NOT DETECTED
Barbiturates: NOT DETECTED
Benzodiazepines: NOT DETECTED
COCAINE: NOT DETECTED
OPIATES: NOT DETECTED
Tetrahydrocannabinol: NOT DETECTED

## 2017-03-01 LAB — ECHOCARDIOGRAM COMPLETE
Height: 66 in
Weight: 3287.5 oz

## 2017-03-01 LAB — TROPONIN I

## 2017-03-01 LAB — D-DIMER, QUANTITATIVE: D-Dimer, Quant: <0.27 ug/mL-FEU (ref 0.00–0.50)

## 2017-03-01 LAB — TSH: TSH: 2.019 u[IU]/mL (ref 0.350–4.500)

## 2017-03-01 LAB — MAGNESIUM: Magnesium: 2.3 mg/dL (ref 1.7–2.4)

## 2017-03-01 LAB — PHOSPHORUS: Phosphorus: 2.4 mg/dL — ABNORMAL LOW (ref 2.5–4.6)

## 2017-03-01 LAB — GLUCOSE, CAPILLARY: GLUCOSE-CAPILLARY: 112 mg/dL — AB (ref 65–99)

## 2017-03-01 MED ORDER — POTASSIUM CHLORIDE IN NACL 20-0.9 MEQ/L-% IV SOLN
INTRAVENOUS | Status: AC
  Administered 2017-03-01: 03:00:00 via INTRAVENOUS

## 2017-03-01 MED ORDER — LISINOPRIL 5 MG PO TABS
5.0000 mg | ORAL_TABLET | Freq: Every day | ORAL | Status: DC
  Administered 2017-03-01 – 2017-03-02 (×2): 5 mg via ORAL
  Filled 2017-03-01 (×2): qty 1

## 2017-03-01 MED ORDER — ALPRAZOLAM 0.25 MG PO TABS
0.2500 mg | ORAL_TABLET | Freq: Three times a day (TID) | ORAL | Status: DC | PRN
  Administered 2017-03-01: 0.25 mg via ORAL
  Filled 2017-03-01: qty 1

## 2017-03-01 MED ORDER — ALPRAZOLAM 0.5 MG PO TABS: 0.2500 mg | ORAL_TABLET | Freq: Two times a day (BID) | ORAL | Status: DC | PRN

## 2017-03-01 MED ORDER — SODIUM CHLORIDE 0.9% FLUSH
3.0000 mL | Freq: Two times a day (BID) | INTRAVENOUS | Status: DC
  Administered 2017-03-01 – 2017-03-02 (×3): 3 mL via INTRAVENOUS

## 2017-03-01 MED ORDER — IPRATROPIUM-ALBUTEROL 0.5-2.5 (3) MG/3ML IN SOLN: 3.0000 mL | Freq: Four times a day (QID) | RESPIRATORY_TRACT | Status: DC | PRN

## 2017-03-01 MED ORDER — ENOXAPARIN SODIUM 40 MG/0.4ML ~~LOC~~ SOLN
40.0000 mg | SUBCUTANEOUS | Status: DC
  Administered 2017-03-01 – 2017-03-02 (×2): 40 mg via SUBCUTANEOUS
  Filled 2017-03-01 (×2): qty 0.4

## 2017-03-01 MED ORDER — ASPIRIN 81 MG PO CHEW
324.0000 mg | CHEWABLE_TABLET | Freq: Once | ORAL | Status: AC
Start: 2017-03-01 — End: 2017-03-01
  Administered 2017-03-01: 324 mg via ORAL
  Filled 2017-03-01: qty 4

## 2017-03-01 MED ORDER — POTASSIUM CHLORIDE CRYS ER 20 MEQ PO TBCR
40.0000 meq | EXTENDED_RELEASE_TABLET | Freq: Once | ORAL | Status: AC
  Administered 2017-03-01: 40 meq via ORAL
  Filled 2017-03-01: qty 2

## 2017-03-01 MED ORDER — ONDANSETRON HCL 4 MG/2ML IJ SOLN: 4.0000 mg | Freq: Four times a day (QID) | INTRAMUSCULAR | Status: DC | PRN

## 2017-03-01 MED ORDER — ONDANSETRON HCL 4 MG PO TABS: 4.0000 mg | ORAL_TABLET | Freq: Four times a day (QID) | ORAL | Status: DC | PRN

## 2017-03-01 MED ORDER — NITROGLYCERIN 0.4 MG SL SUBL
0.4000 mg | SUBLINGUAL_TABLET | Freq: Once | SUBLINGUAL | Status: AC
  Administered 2017-03-01: 0.4 mg via SUBLINGUAL
  Filled 2017-03-01: qty 1

## 2017-03-01 MED ORDER — ENOXAPARIN SODIUM 40 MG/0.4ML ~~LOC~~ SOLN: 40.0000 mg | SUBCUTANEOUS | Status: DC

## 2017-03-01 MED ORDER — ENOXAPARIN SODIUM 40 MG/0.4ML ~~LOC~~ SOLN
40.0000 mg | SUBCUTANEOUS | Status: DC
  Administered 2017-03-02: 40 mg via SUBCUTANEOUS
  Filled 2017-03-01: qty 0.4

## 2017-03-01 MED ORDER — ACETAMINOPHEN 325 MG PO TABS: 650.0000 mg | ORAL_TABLET | Freq: Four times a day (QID) | ORAL | Status: DC | PRN

## 2017-03-01 NOTE — Care Management Obs Status (Signed)
MEDICARE OBSERVATION STATUS NOTIFICATION   Patient Details  Name: William Mcdonald MRN: [id number] Date of Birth: Jul 28, 1975   Medicare Observation Status Notification Given:  Yes    Malcolm MetroChildress, Ayaka Andes Demske, RN 03/01/2017, 9:37 AM

## 2017-03-01 NOTE — Progress Notes (Signed)
*  PRELIMINARY RESULTS* Echocardiogram 2D Echocardiogram has been performed.  Jeryl Columbialliott, Ola Fawver 03/01/2017, 8:45 AM
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
## 2017-03-02 DIAGNOSIS — R55 Syncope and collapse: Secondary | ICD-10-CM | POA: Diagnosis not present

## 2017-03-02 DIAGNOSIS — G4733 Obstructive sleep apnea (adult) (pediatric): Secondary | ICD-10-CM | POA: Diagnosis not present

## 2017-03-02 DIAGNOSIS — R079 Chest pain, unspecified: Secondary | ICD-10-CM

## 2017-03-02 DIAGNOSIS — K219 Gastro-esophageal reflux disease without esophagitis: Secondary | ICD-10-CM | POA: Diagnosis not present

## 2017-03-02 LAB — LIPID PANEL
Cholesterol: 191 mg/dL (ref 0–200)
HDL: 33 mg/dL — ABNORMAL LOW (ref >40)
LDL Cholesterol: 131 mg/dL — ABNORMAL HIGH (ref 0–99)
Total CHOL/HDL Ratio: 5.8 RATIO
Triglycerides: 135 mg/dL (ref <150)
VLDL: 27 mg/dL (ref 0–40)

## 2017-03-02 LAB — HIV ANTIBODY (ROUTINE TESTING W REFLEX): HIV SCREEN 4TH GENERATION: NONREACTIVE

## 2017-03-02 LAB — GLUCOSE, CAPILLARY: GLUCOSE-CAPILLARY: 94 mg/dL (ref 65–99)

## 2017-03-02 MED ORDER — LISINOPRIL 5 MG PO TABS: 5.0000 mg | ORAL_TABLET | Freq: Every day | ORAL | 1 refills | Status: DC

## 2017-03-02 MED ORDER — CITALOPRAM HYDROBROMIDE 20 MG PO TABS: 20.0000 mg | ORAL_TABLET | Freq: Every day | ORAL | 1 refills | Status: DC

## 2017-03-02 MED ORDER — ASPIRIN 81 MG PO TBEC: 81.0000 mg | DELAYED_RELEASE_TABLET | Freq: Every day | ORAL | 1 refills | Status: DC

## 2017-03-02 MED ORDER — PANTOPRAZOLE SODIUM 40 MG PO TBEC: 40.0000 mg | DELAYED_RELEASE_TABLET | Freq: Every day | ORAL | 1 refills | Status: DC

## 2017-03-02 MED ORDER — SIMVASTATIN 40 MG PO TABS: 40.0000 mg | ORAL_TABLET | Freq: Every evening | ORAL | 1 refills | Status: DC

## 2017-03-02 MED ORDER — ASPIRIN EC 81 MG PO TBEC
81.0000 mg | DELAYED_RELEASE_TABLET | Freq: Every day | ORAL | Status: DC
  Administered 2017-03-02: 81 mg via ORAL
  Filled 2017-03-02: qty 1
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
## 2017-03-03 ENCOUNTER — Ambulatory Visit: Payer: Self-pay | Admitting: Internal Medicine

## 2017-03-03 ENCOUNTER — Telehealth: Payer: Self-pay

## 2017-03-03 NOTE — Telephone Encounter (Signed)
Noted.  TCM nurse contact with pt.

## 2017-03-03 NOTE — Telephone Encounter (Signed)
Admit date: 02/28/2017 Discharge date: 03/02/2017  Discharge Diagnoses:  Active Problems:   OSA (obstructive sleep apnea)   Syncope   Chest pain GERD HLD Class 1 obesity  Hx of asthma   Transition Care Management Follow-up Telephone Call   Date discharged? 03/02/2017   How have you been since you were released from the hospital? "feeling good"   Do you understand why you were in the hospital? yes, "Low heart rate and high blood pressure"   Do you understand the discharge instructions? yes   Where were you discharged to? Home.    Items Reviewed:  Medications reviewed: no, meds/list not available at time of call. Advised to bring meds to appt.   Allergies reviewed: yes  Dietary changes reviewed: yes  Referrals reviewed: yes   Functional Questionnaire:   Activities of Daily Living (ADLs):   He states they are independent in the following: ambulation, bathing and hygiene, feeding, continence, grooming, toileting and dressing States they require assistance with the following: None.    Any transportation issues/concerns?: no   Any patient concerns? no   Confirmed importance and date/time of follow-up visits scheduled yes  Provider Appointment booked with PCP on Thursday, 03/16/17 @ 2pm (reqeusted afternoon appt)  Confirmed with patient if condition begins to worsen call PCP or go to the ER.  Patient was given the office number and encouraged to call back with question or concerns.  : yes

## 2017-03-07 ENCOUNTER — Encounter: Payer: Self-pay | Admitting: Family Medicine

## 2017-03-13 ENCOUNTER — Encounter: Payer: Self-pay | Admitting: Physician Assistant
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
## 2017-03-14 ENCOUNTER — Ambulatory Visit: Payer: Self-pay | Admitting: Physician Assistant

## 2017-03-14 DIAGNOSIS — R0989 Other specified symptoms and signs involving the circulatory and respiratory systems: Secondary | ICD-10-CM

## 2017-03-15 ENCOUNTER — Encounter: Payer: Self-pay | Admitting: Physician Assistant

## 2017-03-16 ENCOUNTER — Inpatient Hospital Stay: Payer: Medicare Other | Admitting: Family Medicine

## 2017-03-16 DIAGNOSIS — Z0289 Encounter for other administrative examinations: Secondary | ICD-10-CM
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
## 2017-04-11 ENCOUNTER — Telehealth: Payer: Self-pay | Admitting: Family Medicine

## 2017-04-11 MED ORDER — LISINOPRIL 5 MG PO TABS: 5.0000 mg | ORAL_TABLET | Freq: Every day | ORAL | 1 refills | Status: DC

## 2017-04-11 NOTE — Telephone Encounter (Signed)
Lisinopril 5mg  sent to pharmacy. Pt must have o/v for f/u HTN in 10-14 days. Have him monitor his bp and heart rate daily and bring these numbers in for review with me at next o/v.-thx

## 2017-04-11 NOTE — Telephone Encounter (Signed)
Copied from CRM [id number]. Topic: Quick Communication - See Telephone Encounter >> Apr 11, 2017  3:22 PM Floria RavelingStovall, Shana A wrote: CRM for notification. See Telephone encounter for: 04/11/17. Pt called in and said that Dr Milinda CaveMcgowen gave him some bp meds but he never started taking it.  He said that he lost script and he can tell that he needs to start taking that bp med now.  His face is always hot.  He is unsure of the name of the back but would like another scripted called in  Pharmacy [facility] on file- I verified it

## 2017-04-12 NOTE — Telephone Encounter (Signed)
Left detailed message on patient's phone that RX was sent to pharmacy and patient MUST have OV to discuss BP numbers and heart rate.  Also, explained for pt to keep record of BP / HR numbers for OV.  Okay per DPR.

## 2017-04-24 ENCOUNTER — Ambulatory Visit: Payer: Self-pay | Admitting: Family Medicine

## 2017-04-24 ENCOUNTER — Encounter: Payer: Self-pay | Admitting: Family Medicine

## 2017-04-24 ENCOUNTER — Telehealth: Payer: Self-pay | Admitting: *Deleted

## 2017-04-24 DIAGNOSIS — Z2089 Contact with and (suspected) exposure to other communicable diseases: Secondary | ICD-10-CM

## 2017-04-24 NOTE — Telephone Encounter (Signed)
Ok thanks 

## 2017-04-24 NOTE — Telephone Encounter (Signed)
Pt has not been sent a warning letter. I created and sent one today citing final notice prior to dismissal from all of [facility].

## 2017-04-24 NOTE — Telephone Encounter (Signed)
Pls initiate d/c of pt.-thx

## 2017-04-24 NOTE — Telephone Encounter (Signed)
Pt no showed apts on 03/03/17, 03/14/17, 03/16/17, and 04/24/17. How would you like for proceed?
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
## 2017-06-08 DIAGNOSIS — G8911 Acute pain due to trauma: Secondary | ICD-10-CM | POA: Diagnosis not present

## 2017-06-08 DIAGNOSIS — S61213A Laceration without foreign body of left middle finger without damage to nail, initial encounter: Secondary | ICD-10-CM | POA: Diagnosis not present

## 2017-06-08 DIAGNOSIS — I10 Essential (primary) hypertension: Secondary | ICD-10-CM | POA: Diagnosis not present

## 2017-06-18 DIAGNOSIS — S61213D Laceration without foreign body of left middle finger without damage to nail, subsequent encounter: Secondary | ICD-10-CM | POA: Diagnosis not present

## 2017-06-18 DIAGNOSIS — Z4802 Encounter for removal of sutures: Secondary | ICD-10-CM | POA: Diagnosis not present

## 2017-10-16 ENCOUNTER — Encounter: Payer: Self-pay | Admitting: Family Medicine

## 2017-10-16 ENCOUNTER — Ambulatory Visit (INDEPENDENT_AMBULATORY_CARE_PROVIDER_SITE_OTHER): Payer: Medicare Other | Admitting: Family Medicine

## 2017-10-16 ENCOUNTER — Other Ambulatory Visit (HOSPITAL_COMMUNITY)
Admission: RE | Admit: 2017-10-16 | Discharge: 2017-10-16 | Disposition: A | Discharge status: Home / Self Care | Payer: Medicare Other | Source: Ambulatory Visit | Attending: Family Medicine | Admitting: Family Medicine

## 2017-10-16 VITALS — BP 128/87 | HR 72 | Temp 98.3°F | Resp 16 | Ht 65.5 in | Wt 209.1 lb

## 2017-10-16 DIAGNOSIS — Z3009 Encounter for other general counseling and advice on contraception: Secondary | ICD-10-CM

## 2017-10-16 DIAGNOSIS — J302 Other seasonal allergic rhinitis: Secondary | ICD-10-CM | POA: Diagnosis not present

## 2017-10-16 DIAGNOSIS — Z9114 Patient's other noncompliance with medication regimen: Secondary | ICD-10-CM

## 2017-10-16 DIAGNOSIS — E860 Dehydration: Secondary | ICD-10-CM | POA: Diagnosis not present

## 2017-10-16 DIAGNOSIS — J0100 Acute maxillary sinusitis, unspecified: Secondary | ICD-10-CM | POA: Diagnosis not present

## 2017-10-16 DIAGNOSIS — Z7251 High risk heterosexual behavior: Secondary | ICD-10-CM | POA: Insufficient documentation

## 2017-10-16 MED ORDER — AMOXICILLIN-POT CLAVULANATE 875-125 MG PO TABS: 1.0000 | ORAL_TABLET | Freq: Two times a day (BID) | ORAL | 0 refills | Status: DC

## 2017-10-16 MED ORDER — FEXOFENADINE HCL 180 MG PO TABS: 180.0000 mg | ORAL_TABLET | Freq: Every day | ORAL | 11 refills | Status: DC

## 2017-10-16 MED ORDER — PREDNISONE 10 MG PO TABS: ORAL_TABLET | ORAL | 0 refills | Status: DC

## 2017-10-16 MED ORDER — FLUTICASONE PROPIONATE 50 MCG/ACT NA SUSP: 2.0000 | Freq: Every day | NASAL | 6 refills | Status: DC
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
## 2017-10-16 NOTE — Patient Instructions (Addendum)
Drink at least 65 ounces (EIGHT of the 8 oz bottles or cups of water) per day.  OK to stay off your lisinopril (blood pressure med) at this time.

## 2017-10-17 ENCOUNTER — Telehealth: Payer: Self-pay

## 2017-10-17 LAB — HIV ANTIBODY (ROUTINE TESTING W REFLEX): HIV 1&2 Ab, 4th Generation: NONREACTIVE

## 2017-10-17 LAB — RPR: RPR Ser Ql: NONREACTIVE

## 2017-10-17 NOTE — Telephone Encounter (Signed)
Patient stated that Augmentin and Flonase were to expensive and needed something else called in to pharmacy. Patient does not have insurance.

## 2017-10-18 ENCOUNTER — Encounter: Payer: Self-pay | Admitting: *Deleted

## 2017-10-18 LAB — URINE CYTOLOGY ANCILLARY ONLY
Chlamydia: NEGATIVE
Neisseria Gonorrhea: NEGATIVE

## 2017-10-18 MED ORDER — CEPHALEXIN 500 MG PO CAPS: 500.0000 mg | ORAL_CAPSULE | Freq: Three times a day (TID) | ORAL | 0 refills | Status: AC

## 2017-10-18 NOTE — Telephone Encounter (Signed)
Sorry, generic flonase is the cheapest med of it's kind. I will call in generic keflex for his sinus infection.

## 2017-10-18 NOTE — Telephone Encounter (Signed)
Patient notified and verbalized understanding. 

## 2017-11-08 ENCOUNTER — Encounter: Payer: Self-pay | Admitting: Family Medicine

## 2017-11-27 ENCOUNTER — Encounter: Payer: Medicare Other | Admitting: Family Medicine

## 2017-11-27 ENCOUNTER — Encounter: Payer: Self-pay | Admitting: Family Medicine

## 2017-11-27 DIAGNOSIS — Z0289 Encounter for other administrative examinations: Secondary | ICD-10-CM
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
## 2017-11-28 ENCOUNTER — Telehealth: Payer: Self-pay | Admitting: Family Medicine

## 2017-11-28 NOTE — Telephone Encounter (Signed)
Patient dismissed from [facility] by Santiago BumpersPhil McGowen MD, effective 11/27/17. Dismissal letter sent out by 1st class mail. LM

## 2018-03-01 DIAGNOSIS — I10 Essential (primary) hypertension: Secondary | ICD-10-CM | POA: Diagnosis not present

## 2018-03-01 DIAGNOSIS — E785 Hyperlipidemia, unspecified: Secondary | ICD-10-CM | POA: Diagnosis not present

## 2018-03-01 DIAGNOSIS — R12 Heartburn: Secondary | ICD-10-CM | POA: Diagnosis not present

## 2018-03-01 DIAGNOSIS — Z202 Contact with and (suspected) exposure to infections with a predominantly sexual mode of transmission: Secondary | ICD-10-CM | POA: Diagnosis not present

## 2018-03-01 DIAGNOSIS — K580 Irritable bowel syndrome with diarrhea: Secondary | ICD-10-CM | POA: Diagnosis not present

## 2018-03-09 ENCOUNTER — Ambulatory Visit (INDEPENDENT_AMBULATORY_CARE_PROVIDER_SITE_OTHER): Payer: Medicare Other | Admitting: Urology

## 2018-03-09 DIAGNOSIS — N5201 Erectile dysfunction due to arterial insufficiency: Secondary | ICD-10-CM

## 2018-03-09 DIAGNOSIS — Z302 Encounter for sterilization: Secondary | ICD-10-CM | POA: Diagnosis not present

## 2018-03-09 DIAGNOSIS — N50811 Right testicular pain: Secondary | ICD-10-CM

## 2018-03-15 ENCOUNTER — Encounter: Payer: Self-pay | Admitting: Internal Medicine

## 2018-04-12 ENCOUNTER — Telehealth: Payer: Self-pay | Admitting: *Deleted

## 2018-04-12 NOTE — Telephone Encounter (Signed)
I have contacted patient to advise that in order to protect our patients from the spread of COVID-19, we are trying to keep patient's out of the office and rather complete their visits by phone when possible. Patient states "tell him (Dr Marina Goodell) not to worry about it; aint nothin he can do for me over the phone." He then hung up on me.

## 2018-04-17 ENCOUNTER — Ambulatory Visit: Payer: Self-pay | Admitting: Internal Medicine

## 2018-06-15 ENCOUNTER — Ambulatory Visit: Payer: Medicare Other | Admitting: Urology

## 2018-06-20 DIAGNOSIS — A549 Gonococcal infection, unspecified: Secondary | ICD-10-CM | POA: Diagnosis not present

## 2018-06-20 DIAGNOSIS — Z202 Contact with and (suspected) exposure to infections with a predominantly sexual mode of transmission: Secondary | ICD-10-CM | POA: Diagnosis not present

## 2018-06-20 DIAGNOSIS — R300 Dysuria: Secondary | ICD-10-CM | POA: Diagnosis not present

## 2018-06-20 DIAGNOSIS — A749 Chlamydial infection, unspecified: Secondary | ICD-10-CM | POA: Diagnosis not present

## 2018-06-21 DIAGNOSIS — R300 Dysuria: Secondary | ICD-10-CM | POA: Diagnosis not present

## 2018-06-21 DIAGNOSIS — Z202 Contact with and (suspected) exposure to infections with a predominantly sexual mode of transmission: Secondary | ICD-10-CM | POA: Diagnosis not present

## 2018-09-27 IMAGING — US US CAROTID DUPLEX BILAT
1 series · 13 of 24 positions shown · non-contrast
Comparison: None.

CLINICAL DATA: Syncope

EXAM:
BILATERAL CAROTID DUPLEX ULTRASOUND
TECHNIQUE: Gray scale imaging, color Doppler and duplex ultrasound were
performed of bilateral carotid and vertebral arteries in the neck.

[Series 1: us carotid duplex bilat · 0.06mm/px · 13 of 70 slices shown]
[im 1/70]
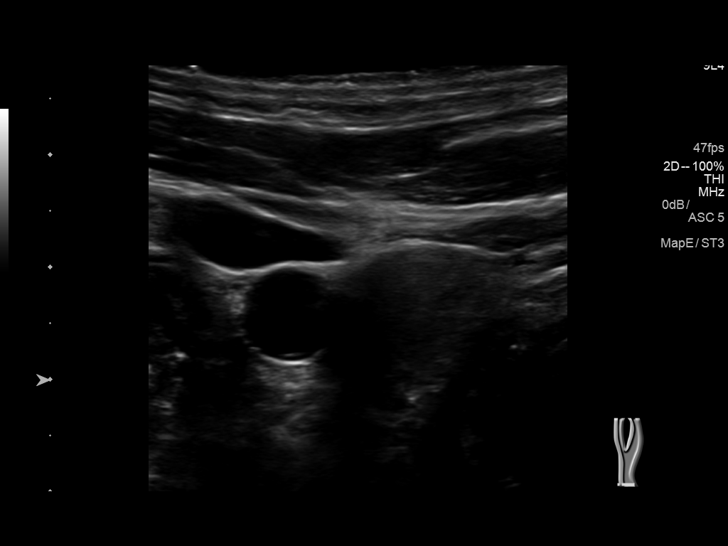
[im 7/70]
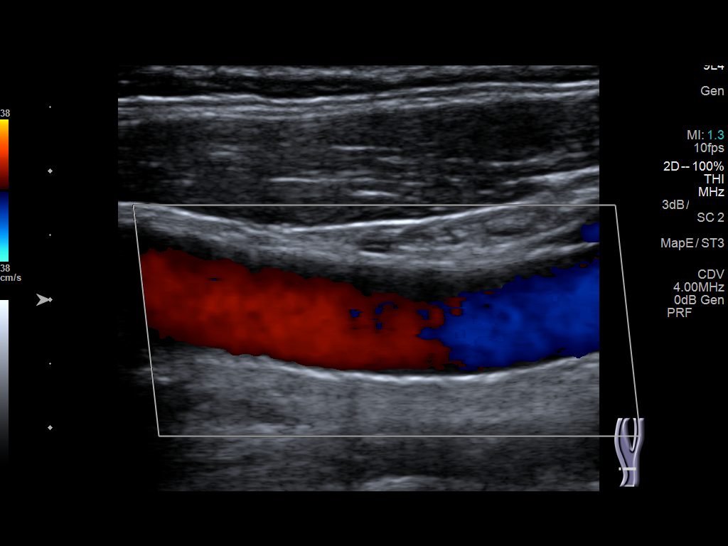
[im 13/70]
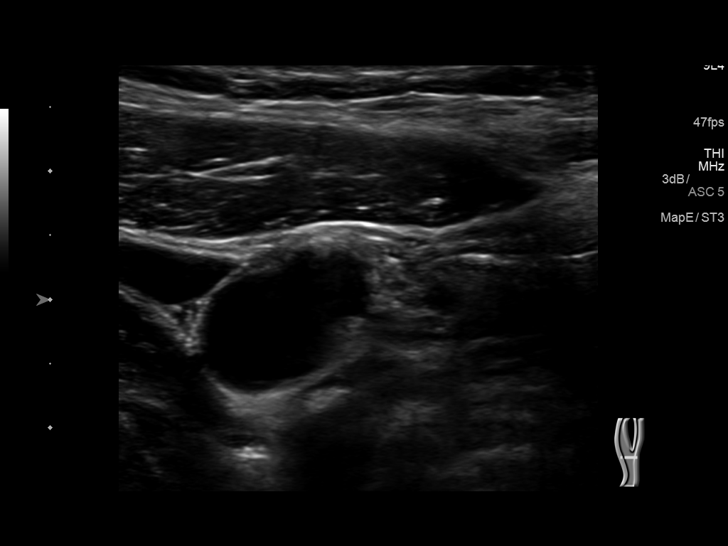
[im 19/70]
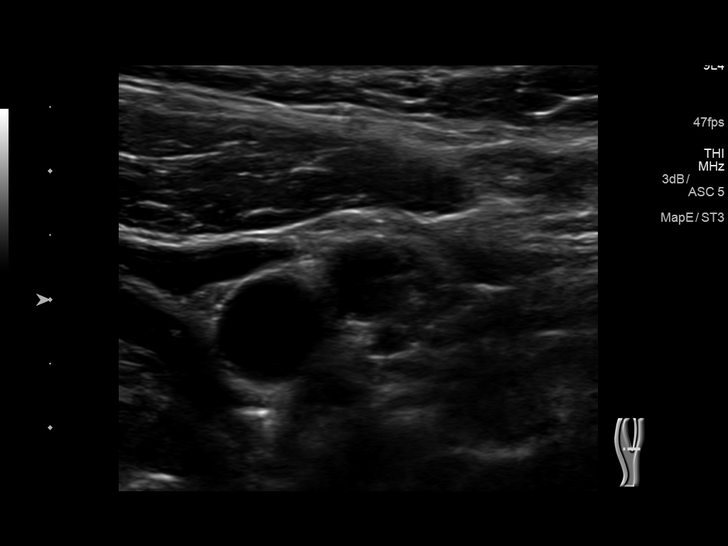
[im 25/70]
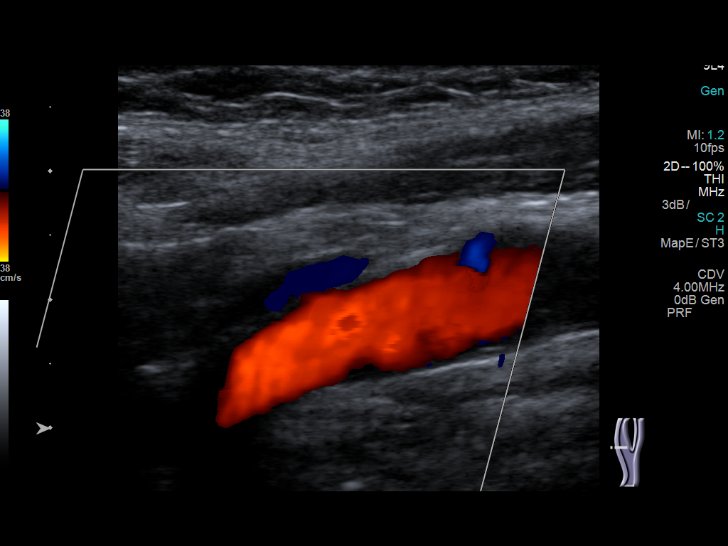
[im 31/70]
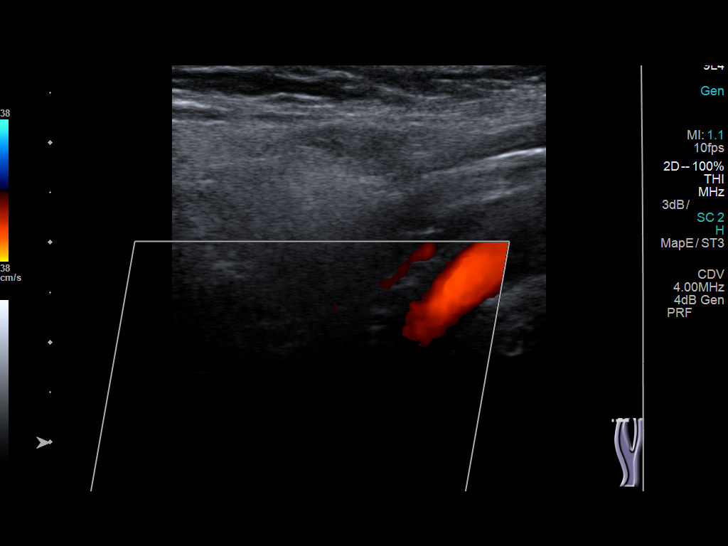
[im 37/70]
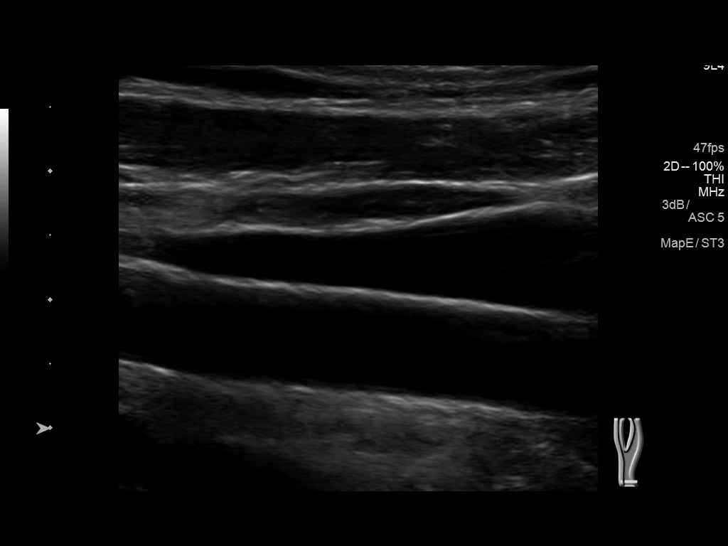
[im 40/70]
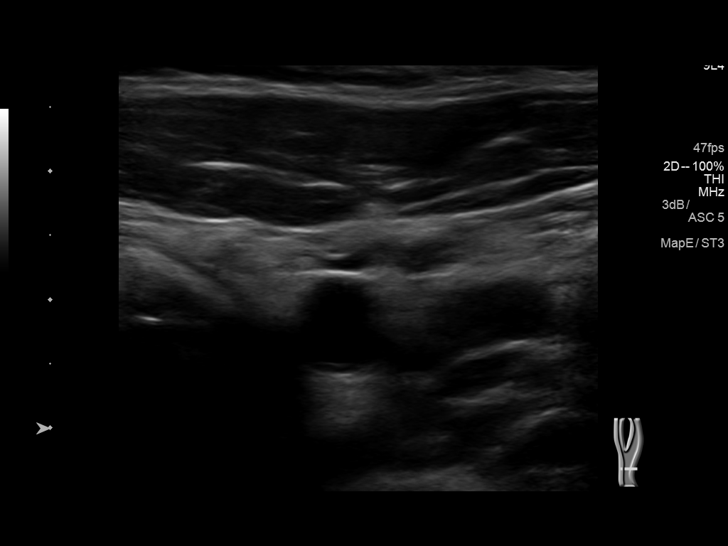
[im 46/70]
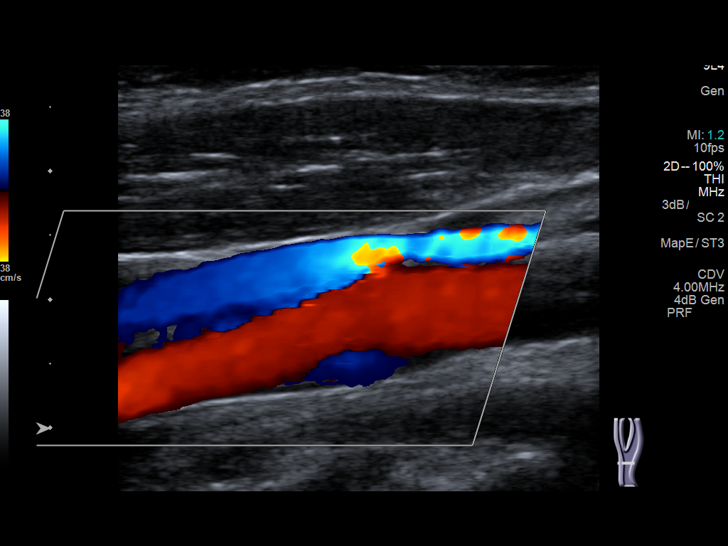
[im 52/70]
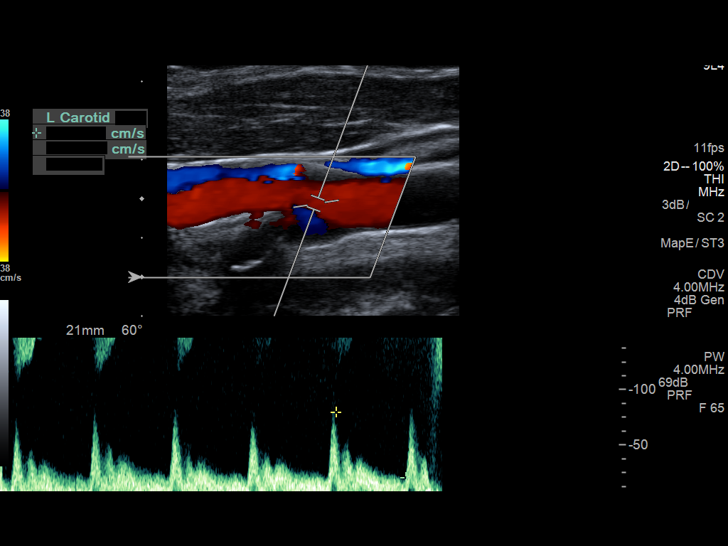
[im 58/70]
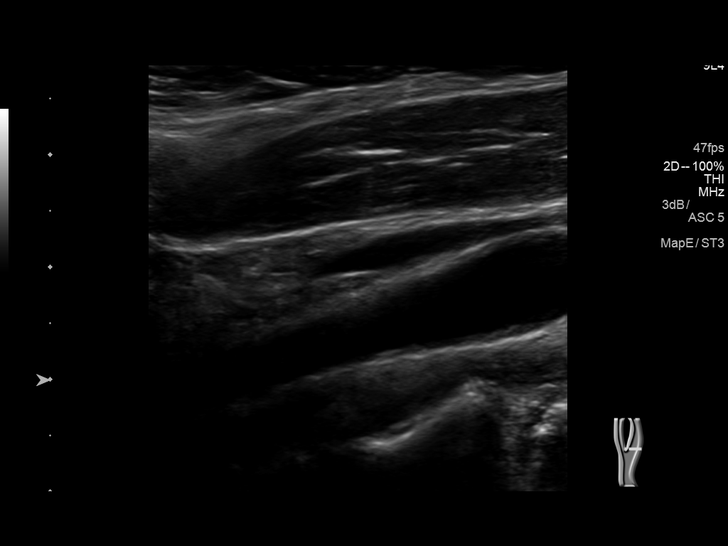
[im 64/70]
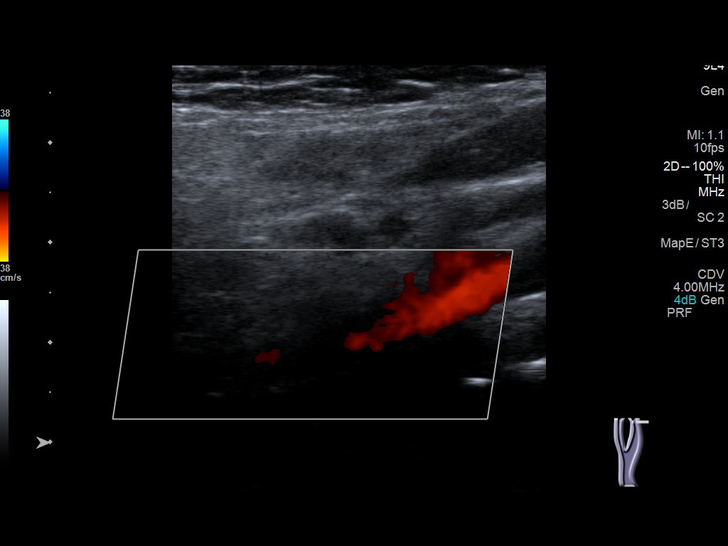
[im 70/70]
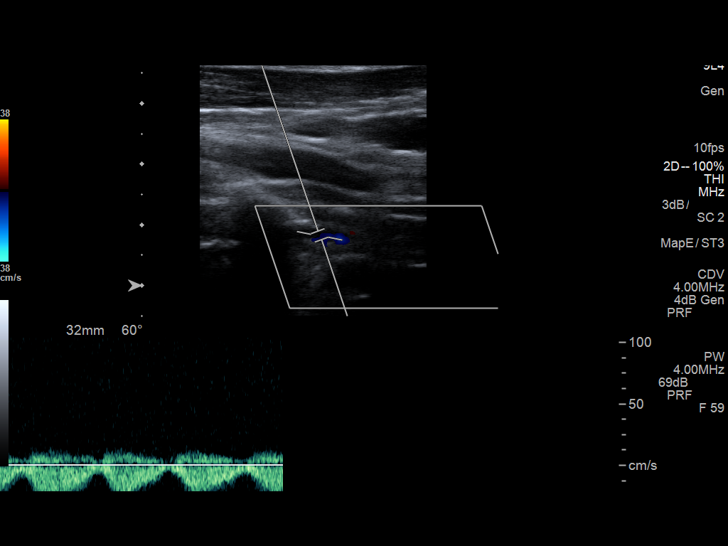

[13 of 24 positions shown; findings below may reference images not displayed]

FINDINGS: Criteria: Quantification of carotid stenosis is based on velocity
parameters that correlate the residual internal carotid diameter
with NASCET-based stenosis levels, using the diameter of the distal
internal carotid lumen as the denominator for stenosis measurement.

The following velocity measurements were obtained:

RIGHT

ICA:  135 cm/sec

CCA:  82 cm/sec

SYSTOLIC ICA/CCA RATIO:

DIASTOLIC ICA/CCA RATIO:

ECA:  115 cm/sec

LEFT

ICA:  118 cm/sec

CCA:  111 cm/sec

SYSTOLIC ICA/CCA RATIO:

DIASTOLIC ICA/CCA RATIO:

ECA:  138 cm/sec

RIGHT CAROTID ARTERY: Little if any plaque in the bulb. Low
resistance internal carotid Doppler pattern.

RIGHT VERTEBRAL ARTERY:  Antegrade.

LEFT CAROTID ARTERY: Little if any plaque in the bulb. Low
resistance internal carotid Doppler pattern.

LEFT VERTEBRAL ARTERY: A normal appearing left vertebral artery
cannot be visualized. Avascular structure was noted with flow in the
retrograde direction. Doppler analysis demonstrates a phasic
waveform. It is difficult to determine if this is a venous structure
or retrograde flow in the left vertebral artery.
IMPRESSION: Less than 50% stenosis in the right and left internal carotid
arteries.

Normal antegrade flow in the right vertebral artery.

The left vertebral artery is abnormal. It is either occluded or
retrograde in flow. See above comments. CT angio of the neck can be
performed to further delineate.

## 2019-05-29 DIAGNOSIS — Z024 Encounter for examination for driving license: Secondary | ICD-10-CM | POA: Diagnosis not present

## 2019-06-12 DIAGNOSIS — A630 Anogenital (venereal) warts: Secondary | ICD-10-CM | POA: Diagnosis not present

## 2019-10-02 DIAGNOSIS — A630 Anogenital (venereal) warts: Secondary | ICD-10-CM | POA: Diagnosis not present

## 2019-12-26 IMAGING — CT CT HEAD W/O CM
4 series · 16 of 47 positions shown, 18 images · non-contrast
Comparison: None.

CLINICAL DATA: Fall striking head on ground. Loss of consciousness.
Altered mental status.

EXAM:
CT HEAD WITHOUT CONTRAST
TECHNIQUE: Contiguous axial images were obtained from the base of the skull
through the vertex without intravenous contrast.

[Series 2: head trauma wo · axial · 0.46mm/px · z∈[+24,+134]mm · 7 of 30 slices shown, 9 images]
[im 4/30  brain]
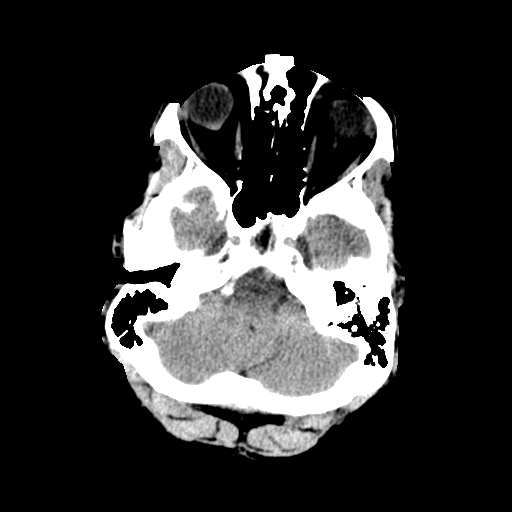
[im 4/30  bone]
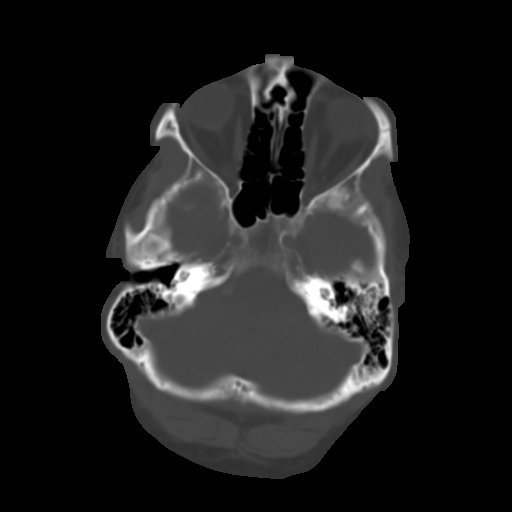
[im 8/30  brain]
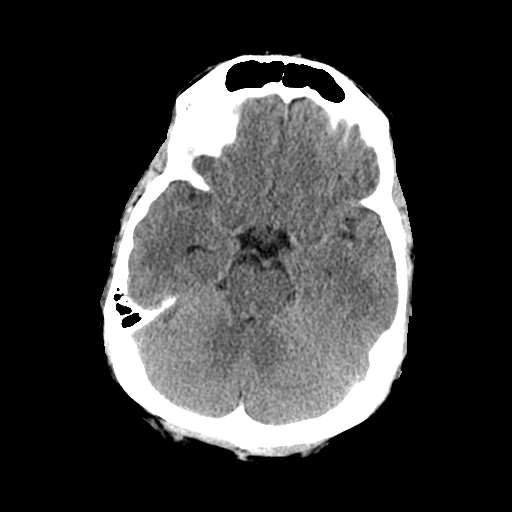
[im 11/30  brain]
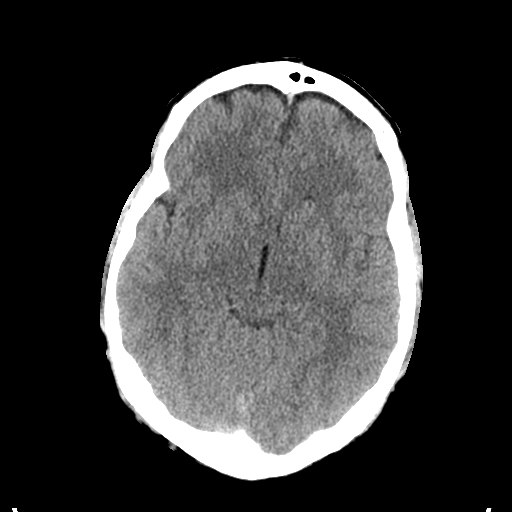
[im 15/30  brain]
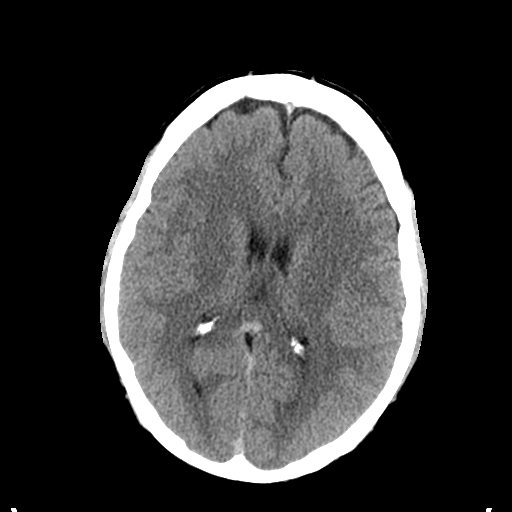
[im 19/30  brain]
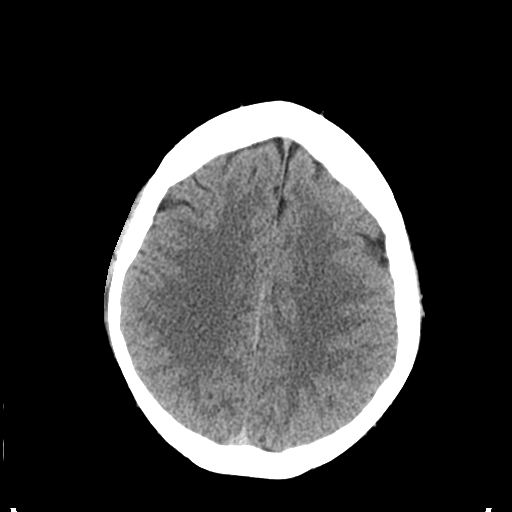
[im 19/30  bone]
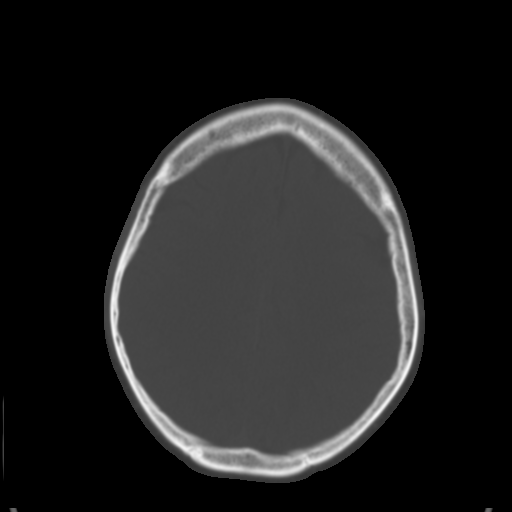
[im 22/30  brain]
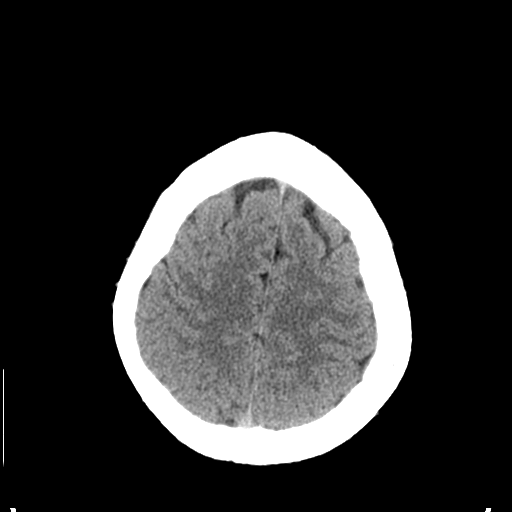
[im 26/30  brain]
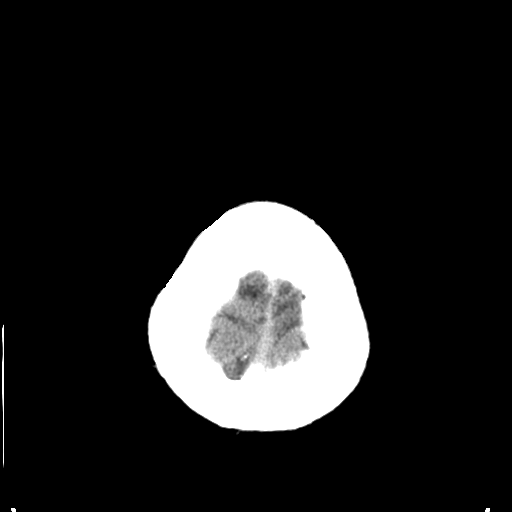

[Series 3: head bone · axial · 0.46mm/px · z∈[+23,+53]mm · 3 of 75 slices shown]
[im 8/75  bone]
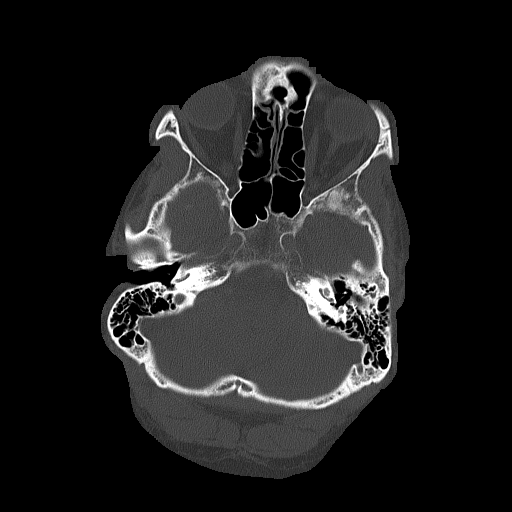
[im 15/75  bone]
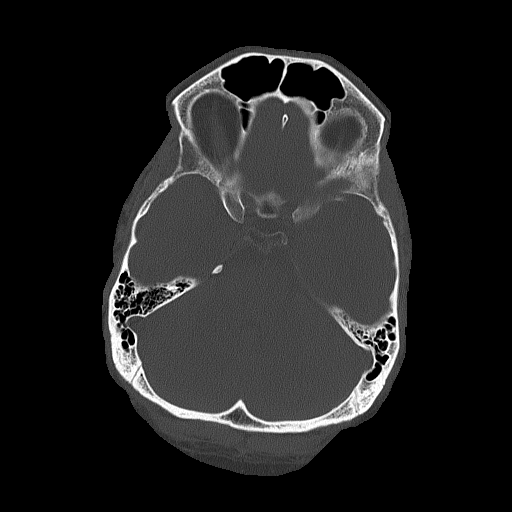
[im 23/75  bone]
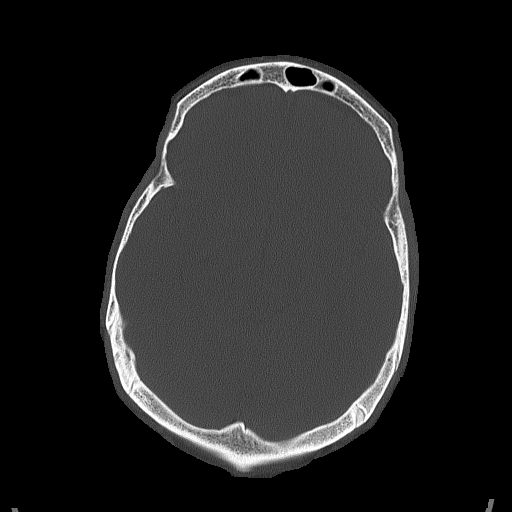

[Series 4: coronal soft tissue · coronal · 0.32mm/px · 3 of 71 slices shown]
[im 24/71  brain]
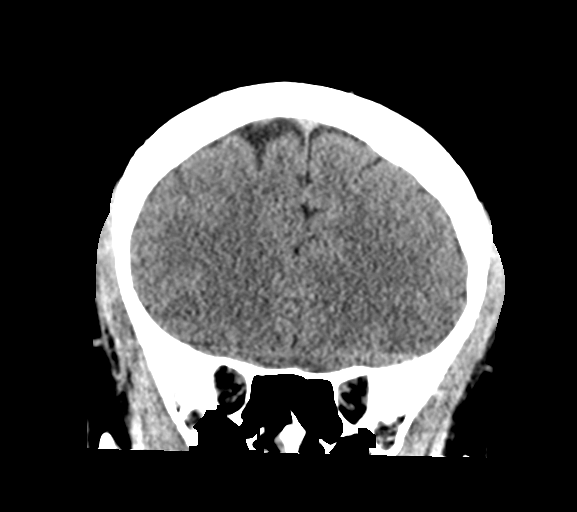
[im 32/71  brain]
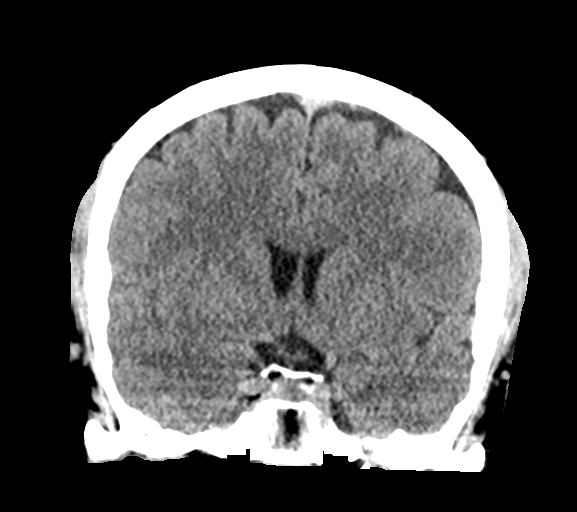
[im 39/71  brain]
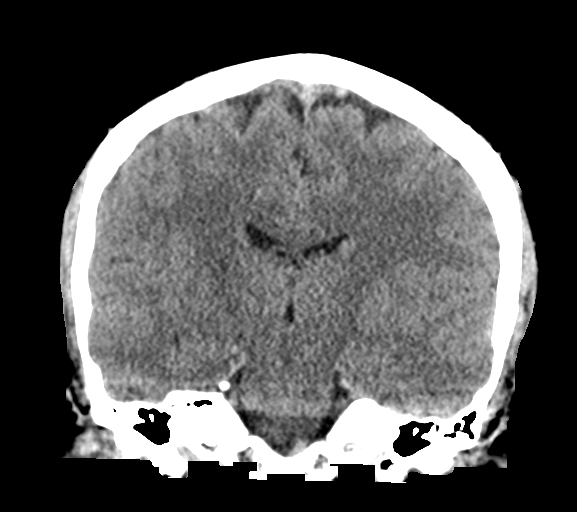

[Series 5: sagittal soft tissue · sagittal · 0.34mm/px · 3 of 59 slices shown]
[im 20/59  brain]
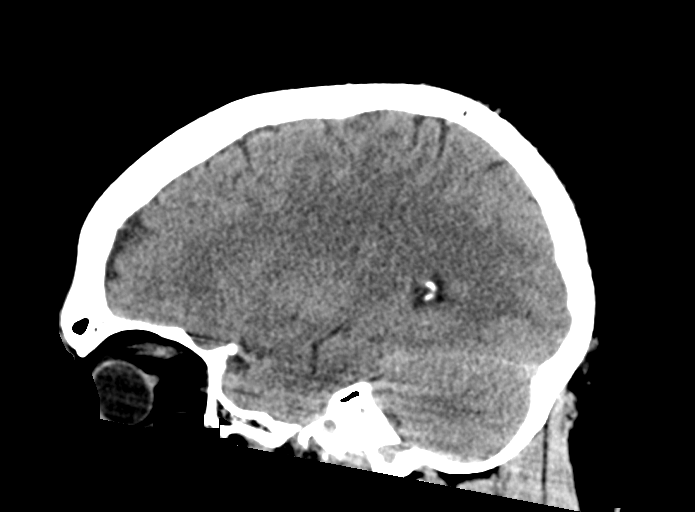
[im 30/59  brain]
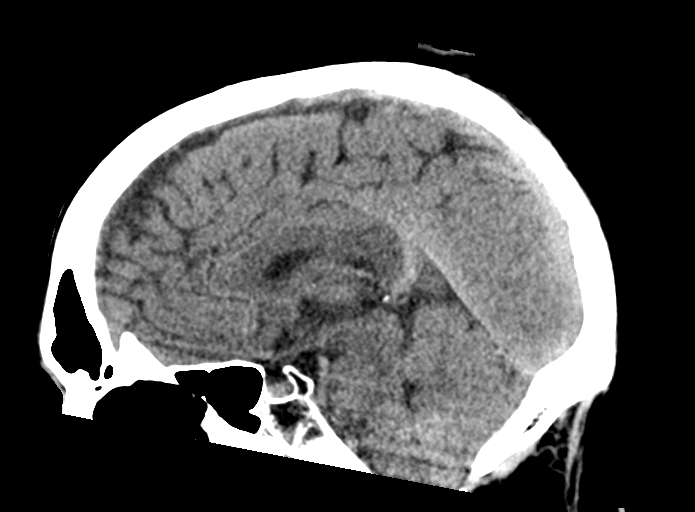
[im 39/59  brain]
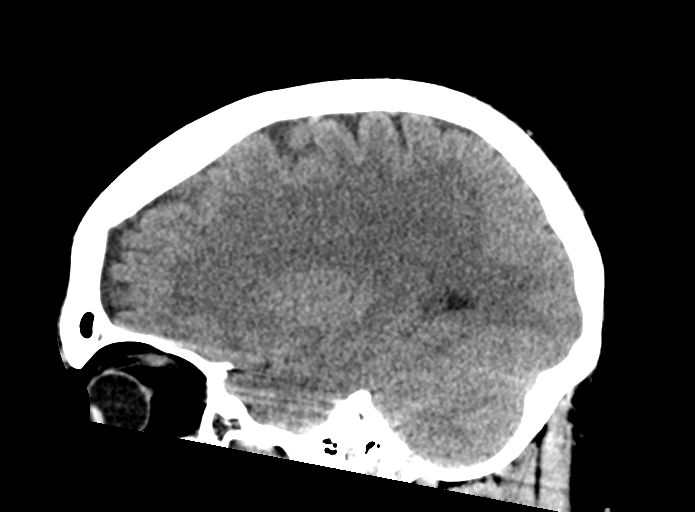

[16 of 47 positions shown; findings below may reference images not displayed]

FINDINGS: Brain: No intracranial hemorrhage, mass effect, or midline shift. No
hydrocephalus. The basilar cisterns are patent. No evidence of
territorial infarct or acute ischemia. No extra-axial or
intracranial fluid collection. Low lying cerebellar tonsils are only
partially included in the field of view.

Vascular: No hyperdense vessel or unexpected calcification.

Skull: No fracture or focal lesion.

Sinuses/Orbits: Paranasal sinuses and mastoid air cells are clear.
The visualized orbits are unremarkable.

Other: None.
IMPRESSION: No acute intracranial abnormality.

## 2020-12-21 ENCOUNTER — Other Ambulatory Visit: Payer: Self-pay

## 2020-12-21 ENCOUNTER — Emergency Department (HOSPITAL_COMMUNITY)
Admission: EM | Admit: 2020-12-21 | Discharge: 2020-12-21 | Disposition: A | Discharge status: Home / Self Care | Payer: Medicare Other | Attending: Emergency Medicine | Admitting: Emergency Medicine

## 2020-12-21 ENCOUNTER — Encounter (HOSPITAL_COMMUNITY): Payer: Self-pay | Admitting: *Deleted

## 2020-12-21 ENCOUNTER — Emergency Department (HOSPITAL_COMMUNITY): Payer: Medicare Other

## 2020-12-21 DIAGNOSIS — I10 Essential (primary) hypertension: Secondary | ICD-10-CM | POA: Diagnosis not present

## 2020-12-21 DIAGNOSIS — R1084 Generalized abdominal pain: Secondary | ICD-10-CM | POA: Diagnosis present

## 2020-12-21 DIAGNOSIS — K5792 Diverticulitis of intestine, part unspecified, without perforation or abscess without bleeding: Secondary | ICD-10-CM | POA: Insufficient documentation

## 2020-12-21 DIAGNOSIS — Z7951 Long term (current) use of inhaled steroids: Secondary | ICD-10-CM | POA: Insufficient documentation

## 2020-12-21 DIAGNOSIS — K5900 Constipation, unspecified: Secondary | ICD-10-CM | POA: Insufficient documentation

## 2020-12-21 DIAGNOSIS — J45909 Unspecified asthma, uncomplicated: Secondary | ICD-10-CM | POA: Insufficient documentation

## 2020-12-21 LAB — URINALYSIS, ROUTINE W REFLEX MICROSCOPIC
Bilirubin Urine: NEGATIVE
Glucose, UA: NEGATIVE mg/dL
Hgb urine dipstick: NEGATIVE
Ketones, ur: NEGATIVE mg/dL
Leukocytes,Ua: NEGATIVE
Nitrite: NEGATIVE
Protein, ur: NEGATIVE mg/dL
Specific Gravity, Urine: 1.021 (ref 1.005–1.030)
pH: 5 (ref 5.0–8.0)

## 2020-12-21 LAB — COMPREHENSIVE METABOLIC PANEL
ALT: 23 U/L (ref 0–44)
AST: 18 U/L (ref 15–41)
Albumin: 4 g/dL (ref 3.5–5.0)
Alkaline Phosphatase: 65 U/L (ref 38–126)
Anion gap: 6 (ref 5–15)
BUN: 16 mg/dL (ref 6–20)
CO2: 27 mmol/L (ref 22–32)
Calcium: 8.8 mg/dL — ABNORMAL LOW (ref 8.9–10.3)
Chloride: 105 mmol/L (ref 98–111)
Creatinine, Ser: 1.07 mg/dL (ref 0.61–1.24)
GFR, Estimated: >60 mL/min (ref >60)
Glucose, Bld: 109 mg/dL — ABNORMAL HIGH (ref 70–99)
Potassium: 3.6 mmol/L (ref 3.5–5.1)
Sodium: 138 mmol/L (ref 135–145)
Total Bilirubin: 0.7 mg/dL (ref 0.3–1.2)
Total Protein: 7.9 g/dL (ref 6.5–8.1)

## 2020-12-21 LAB — LIPASE, BLOOD: Lipase: 27 U/L (ref 11–51)

## 2020-12-21 LAB — CBC
HCT: 44.1 % (ref 39.0–52.0)
Hemoglobin: 14.8 g/dL (ref 13.0–17.0)
MCH: 29.7 pg (ref 26.0–34.0)
MCHC: 33.6 g/dL (ref 30.0–36.0)
MCV: 88.4 fL (ref 80.0–100.0)
Platelets: 344 10*3/uL (ref 150–400)
RBC: 4.99 MIL/uL (ref 4.22–5.81)
RDW: 12.8 % (ref 11.5–15.5)
WBC: 14.1 10*3/uL — ABNORMAL HIGH (ref 4.0–10.5)
nRBC: 0 % (ref 0.0–0.2)

## 2020-12-21 MED ORDER — SODIUM CHLORIDE 0.9 % IV BOLUS
1000.0000 mL | Freq: Once | INTRAVENOUS | Status: AC
  Administered 2020-12-21: 1000 mL via INTRAVENOUS

## 2020-12-21 MED ORDER — AMOXICILLIN-POT CLAVULANATE 875-125 MG PO TABS: 1.0000 | ORAL_TABLET | Freq: Two times a day (BID) | ORAL | 0 refills | Status: DC

## 2020-12-21 MED ORDER — IOHEXOL 300 MG/ML  SOLN
100.0000 mL | Freq: Once | INTRAMUSCULAR | Status: AC | PRN
  Administered 2020-12-21: 100 mL via INTRAVENOUS

## 2020-12-21 MED ORDER — AMOXICILLIN-POT CLAVULANATE 875-125 MG PO TABS
1.0000 | ORAL_TABLET | Freq: Once | ORAL | Status: AC
  Administered 2020-12-21: 1 via ORAL
  Filled 2020-12-21: qty 1

## 2020-12-21 MED ORDER — MORPHINE SULFATE (PF) 4 MG/ML IV SOLN
4.0000 mg | Freq: Once | INTRAVENOUS | Status: AC
  Administered 2020-12-21: 4 mg via INTRAVENOUS
  Filled 2020-12-21: qty 1

## 2020-12-21 NOTE — ED Provider Notes (Signed)
[facility] EMERGENCY DEPARTMENT Provider Note   CSN: [id number] Arrival date & time: 12/21/20  1916     History Chief Complaint  Patient presents with   Abdominal Pain    William Mcdonald is a 45 y.o. male.  He is here with a complaint of 1 week of abdominal pain and cramps in his back.  It seems to be in his lower abdomen although it is also occurring all over.  Nausea no vomiting.  No urinary symptoms.  Feels constipated and tried some laxatives without any improvement.  Had fever at home but did not check his temperature.  Prior history of appendectomy.  No cough or shortness of breath.  Rates his pain is 8 out of 10 currently  The history is provided by the patient.  Abdominal Pain Pain location:  Generalized Pain quality: aching and cramping   Pain radiates to:  Back Pain severity:  Severe Onset quality:  Gradual Duration:  6 days Timing:  Constant Progression:  Waxing and waning Chronicity:  New Context: not sick contacts and not trauma   Relieved by:  Nothing Worsened by:  Movement and palpation Ineffective treatments:  Bowel activity Associated symptoms: constipation, fever and nausea   Associated symptoms: no chest pain, no cough, no dysuria, no shortness of breath, no sore throat and no vomiting       Past Medical History:  Diagnosis Date   Asthma    GAD (generalized anxiety disorder)    GERD (gastroesophageal reflux disease)    Hyperlipidemia 10/27/2011   Hypertension    IBS (irritable bowel syndrome)    Low testosterone 10/27/2011   MDD (major depressive disorder), recurrent episode ([place])    Normal coronary arteries 2009   Obese    OSA (obstructive sleep apnea)    previously bipap but pt reports PAP intolerance.    Patient Active Problem List   Diagnosis Date Noted   Chest pain    Syncope 03/01/2017   Genitourinary pain 06/21/2012   Major depressive disorder, recurrent episode, moderate ([place]) 06/14/2012   Panic disorder 06/14/2012   Hyperlipidemia  10/27/2011   Low testosterone 10/27/2011   Preventative health care 10/27/2011   Obese    Hypertension    Asthma    OSA (obstructive sleep apnea)    ESOPHAGITIS, REFLUX 06/17/2008   GERD 06/17/2008   IBS 06/17/2008   DYSPHAGIA UNSPECIFIED 06/17/2008   ABDOMINAL PAIN -GENERALIZED 06/17/2008    Past Surgical History:  Procedure Laterality Date   ADENOIDECTOMY     APPENDECTOMY     disabled  secondary to bilateral leg injuries     right femur repair s/p MI     steel rod in and removed from [place] History  Problem Relation Age of Onset   Heart disease Father    Hyperlipidemia Father    Hypertension Father    Diabetes Paternal Grandmother    Hypertension Paternal Grandmother    Colon cancer Paternal Grandfather    Cancer Paternal Grandfather        colon and  bladder?   Anxiety disorder Mother    Cancer Maternal Grandmother     Social History   Tobacco Use   Smoking status: Never   Smokeless tobacco: Never  Substance Use Topics   Alcohol use: No   Drug use: No    Home Medications Prior to Admission medications   Medication Sig Start Date End Date Taking? Authorizing Provider  fexofenadine (ALLEGRA) 180 MG tablet Take 1 tablet (180 mg total) by mouth daily. 10/16/17 10/16/18  McGowen, Adrian Blackwater, MD  fluticasone (FLONASE) 50 MCG/ACT nasal spray Place 2 sprays into both nostrils daily. 10/16/17   McGowen, Adrian Blackwater, MD  predniSONE (DELTASONE) 10 MG tablet 3 tabs po qd x 2d, then 2 tabs po qd x 2d, then 1 tab po qd x 2d 10/16/17   McGowen, Adrian Blackwater, MD    Allergies    Patient has no known allergies.  Review of Systems   Review of Systems  Constitutional:  Positive for fever.  HENT:  Negative for sore throat.   Eyes:  Negative for visual disturbance.  Respiratory:  Negative for cough and shortness of breath.   Cardiovascular:  Negative for chest pain.  Gastrointestinal:  Positive for abdominal pain, constipation and nausea. Negative for  vomiting.  Genitourinary:  Negative for dysuria.  Musculoskeletal:  Positive for back pain.  Skin:  Negative for rash.  Neurological:  Negative for headaches.   Physical Exam Updated Vital Signs BP (!) 141/85   Pulse 77   Temp 100.1 F (37.8 C)   Resp 11   Ht 5\' 6"  (1.676 m)   Wt 99.8 kg   SpO2 97%   BMI 35.51 kg/m   Physical Exam Vitals and nursing note reviewed.  Constitutional:      General: He is not in acute distress.    Appearance: Normal appearance. He is well-developed.  HENT:     Head: Normocephalic and atraumatic.  Eyes:     Conjunctiva/sclera: Conjunctivae normal.  Cardiovascular:     Rate and Rhythm: Normal rate and regular rhythm.     Heart sounds: No murmur heard. Pulmonary:     Effort: Pulmonary effort is normal. No respiratory distress.     Breath sounds: Normal breath sounds.  Abdominal:     Palpations: Abdomen is soft.     Tenderness: There is generalized abdominal tenderness. There is no guarding or rebound.  Musculoskeletal:        General: No swelling.     Cervical back: Neck supple.  Skin:    General: Skin is warm and dry.     Capillary Refill: Capillary refill takes less than 2 seconds.  Neurological:     General: No focal deficit present.     Mental Status: He is alert.  Psychiatric:        Mood and Affect: Mood normal.    ED Results / Procedures / Treatments   Labs (all labs ordered are listed, but only abnormal results are displayed) Labs Reviewed  COMPREHENSIVE METABOLIC PANEL - Abnormal; Notable for the following components:      Result Value   Glucose, Bld 109 (*)    Calcium 8.8 (*)    All other components within normal limits  CBC - Abnormal; Notable for the following components:   WBC 14.1 (*)    All other components within normal limits  LIPASE, BLOOD  URINALYSIS, ROUTINE W REFLEX MICROSCOPIC    EKG None  Radiology CT Abdomen Pelvis W Contrast  Result Date: 12/21/2020 CLINICAL DATA:  Acute abdominal pain. EXAM:  CT ABDOMEN AND PELVIS WITH CONTRAST TECHNIQUE: Multidetector CT imaging of the abdomen and pelvis was performed using the standard protocol following bolus administration of intravenous contrast. CONTRAST:  140mL OMNIPAQUE IOHEXOL 300 MG/ML  SOLN COMPARISON:  CT abdomen pelvis dated 04/30/2004. FINDINGS: Lower chest: The visualized lung bases are clear. No intra-abdominal free air or free fluid. Hepatobiliary:  No focal liver abnormality is seen. No gallstones, gallbladder wall thickening, or biliary dilatation. Pancreas: Unremarkable. No pancreatic ductal dilatation or surrounding inflammatory changes. Spleen: Normal in size without focal abnormality. Adrenals/Urinary Tract: The adrenal glands are unremarkable. There is a 4.5 cm right renal upper pole cyst. There is no hydronephrosis on either side. There is symmetric enhancement and excretion of contrast by both kidneys. The visualized ureters and urinary bladder appear unremarkable. Stomach/Bowel: There is sigmoid diverticulitis. No diverticular abscess or perforation. There is no bowel obstruction or active inflammation. Appendectomy. Vascular/Lymphatic: The abdominal aorta and IVC unremarkable. No portal venous gas. There is no adenopathy. Reproductive: The prostate and seminal vesicles are grossly unremarkable. No pelvic mass. Other: Small fat containing bilateral inguinal hernias. Musculoskeletal: No acute osseous pathology. Evidence of prior right femoral nail. IMPRESSION: Sigmoid diverticulitis. No diverticular abscess or perforation. Electronically Signed   By: Elgie Collard M.D.   On: 12/21/2020 23:19    Procedures Procedures   Medications Ordered in ED Medications  sodium chloride 0.9 % bolus 1,000 mL (0 mLs Intravenous Stopped 12/21/20 2337)  morphine 4 MG/ML injection 4 mg (4 mg Intravenous Given 12/21/20 2209)  iohexol (OMNIPAQUE) 300 MG/ML solution 100 mL (100 mLs Intravenous Contrast Given 12/21/20 2255)  amoxicillin-clavulanate  (AUGMENTIN) 875-125 MG per tablet 1 tablet (1 tablet Oral Given 12/21/20 2344)    ED Course  I have reviewed the triage vital signs and the nursing notes.  Pertinent labs & imaging results that were available during my care of the patient were reviewed by me and considered in my medical decision making (see chart for details).  Clinical Course as of 12/22/20 1044  Mon Dec 21, 2020  2326 Reviewed results of work-up with patient.  He says he feels like he can manage this at home.  We will give dose of antibiotics here.  Recommended clear liquid diet and close follow-up with his PCP and return instructions discussed [MB]    Clinical Course User Index [MB] Terrilee Files, MD   MDM Rules/Calculators/A&P                          This patient complains of abdominal pain back pain fever; this involves an extensive number of treatment Options and is a complaint that carries with it a high risk of complications and Morbidity. The differential includes diverticulitis, colitis, UTI abscess, perforation  I ordered, reviewed and interpreted labs, which included CBC with elevated white count, stable hemoglobin, chemistries normal other than mildly elevated glucose, LFTs and lipase normal, urinalysis without signs of acute I ordered medication IV fluids a and pain medicine, oral antibiotics I ordered imaging studies which included CT abdomen pelvis and I independently    visualized and interpreted imaging which showed acute sigmoid diverticulitis Additional history obtained from patient significant other Previous records obtained and reviewed in epic no recent admissions   After the interventions stated above, I reevaluated the patient and found patient be reasonably comfortable.  Discussed admission versus outpatient management and he is comfortable with outpatient.  Return instructions discussed   Final Clinical Impression(s) / ED Diagnoses Final diagnoses:  Acute diverticulitis    Rx / DC  Orders ED Discharge Orders          Ordered    amoxicillin-clavulanate (AUGMENTIN) 875-125 MG tablet  Every 12 hours        12/21/20 2327             Meridee Score  C, MD 12/22/20 1045

## 2020-12-21 NOTE — ED Triage Notes (Signed)
Pt c/o flank pain that radiates around to front x 6 days ago; pt states the pain has gotten progressively worse; pt has had fevers with the pain; pt having diarrhea

## 2020-12-21 NOTE — Discharge Instructions (Signed)
You were seen in the emergency department for lower abdominal pain and fever.  Your CAT scan showed acute diverticulitis.  We talked about admission and you felt you could manage this at home.  Please finish your antibiotics.  Clear liquid diet advance as tolerated.  Return to the emergency department if fever or worsening pain.

## 2021-07-08 ENCOUNTER — Other Ambulatory Visit (HOSPITAL_BASED_OUTPATIENT_CLINIC_OR_DEPARTMENT_OTHER): Payer: Self-pay

## 2021-07-08 DIAGNOSIS — G4730 Sleep apnea, unspecified: Secondary | ICD-10-CM

## 2021-07-23 ENCOUNTER — Ambulatory Visit: Payer: Medicare Other | Admitting: Neurology

## 2021-08-04 ENCOUNTER — Encounter: Payer: Medicare Other | Admitting: Neurology

## 2021-10-16 ENCOUNTER — Encounter (HOSPITAL_COMMUNITY): Payer: Self-pay

## 2021-10-16 ENCOUNTER — Other Ambulatory Visit: Payer: Self-pay

## 2021-10-16 ENCOUNTER — Emergency Department (HOSPITAL_COMMUNITY)
Admission: EM | Admit: 2021-10-16 | Discharge: 2021-10-16 | Disposition: A | Discharge status: Home / Self Care | Payer: Medicare Other | Attending: Emergency Medicine | Admitting: Emergency Medicine

## 2021-10-16 DIAGNOSIS — S50362A Insect bite (nonvenomous) of left elbow, initial encounter: Secondary | ICD-10-CM | POA: Diagnosis not present

## 2021-10-16 DIAGNOSIS — W57XXXA Bitten or stung by nonvenomous insect and other nonvenomous arthropods, initial encounter: Secondary | ICD-10-CM | POA: Insufficient documentation

## 2021-10-16 MED ORDER — CEFADROXIL 500 MG PO CAPS: 500.0000 mg | ORAL_CAPSULE | Freq: Two times a day (BID) | ORAL | 0 refills | Status: DC

## 2021-10-16 NOTE — ED Provider Notes (Signed)
[facility] EMERGENCY DEPARTMENT Provider Note   CSN: [id number] Arrival date & time: 10/16/21  1802     History Chief Complaint  Patient presents with   Insect Bite    William Mcdonald is a 46 y.o. male patient who presents to the emergency department today for further evaluation of a insect bite to the left elbow that has been there for 3 days.  He states that it is getting increasingly more swollen, red, and draining white material.  He denies any fever or chills.  He does not recall what bit him.    HPI     Home Medications Prior to Admission medications   Medication Sig Start Date End Date Taking? Authorizing Provider  cefadroxil (DURICEF) 500 MG capsule Take 1 capsule (500 mg total) by mouth 2 (two) times daily. 10/16/21  Yes Raul Del, Rechy Bost M, PA-C  fexofenadine (ALLEGRA) 180 MG tablet Take 1 tablet (180 mg total) by mouth daily. 10/16/17 10/16/18  McGowen, Adrian Blackwater, MD  fluticasone (FLONASE) 50 MCG/ACT nasal spray Place 2 sprays into both nostrils daily. 10/16/17   McGowen, Adrian Blackwater, MD  predniSONE (DELTASONE) 10 MG tablet 3 tabs po qd x 2d, then 2 tabs po qd x 2d, then 1 tab po qd x 2d 10/16/17   McGowen, Adrian Blackwater, MD      Allergies    Patient has no known allergies.    Review of Systems   Review of Systems  All other systems reviewed and are negative.   Physical Exam Updated Vital Signs BP (!) 138/93 (BP Location: Right Arm)   Pulse 85   Temp 98.9 F (37.2 C)   Resp 17   Ht 5\' 6"  (1.676 m)   Wt 95.3 kg   SpO2 98%   BMI 33.89 kg/m  Physical Exam Vitals and nursing note reviewed.  Constitutional:      Appearance: Normal appearance.  HENT:     Head: Normocephalic and atraumatic.  Eyes:     General:        Right eye: No discharge.        Left eye: No discharge.     Conjunctiva/sclera: Conjunctivae normal.  Pulmonary:     Effort: Pulmonary effort is normal.  Skin:    General: Skin is warm and dry.     Findings: No rash.     Comments: 5 cm in diameter of  erythema and slight firmness.  Not actively draining or obvious fluctuance.  There is a well-healing scab over the left elbow.  Neurological:     General: No focal deficit present.     Mental Status: He is alert.  Psychiatric:        Mood and Affect: Mood normal.        Behavior: Behavior normal.     ED Results / Procedures / Treatments   Labs (all labs ordered are listed, but only abnormal results are displayed) Labs Reviewed - No data to display  EKG None  Radiology No results found.  Procedures Procedures    Medications Ordered in ED Medications - No data to display  ED Course/ Medical Decision Making/ A&P                           Medical Decision Making JABARRI STEFANELLI is a 46 y.o. male patient who presents to the emergency department today for further evaluation of an insect bite.  I have a low suspicion for any deep-seated  infection at this time.  There is no obvious abscess.  I will place the patient on Duricef and encouraged him to use warm compresses for the next 5 days 3 times daily.  Patient amenable this plan.  Strict return precautions were discussed.  He is safe for discharge.   Risk Prescription drug management.   Final Clinical Impression(s) / ED Diagnoses Final diagnoses:  Insect bite of left elbow, initial encounter    Rx / DC Orders ED Discharge Orders          Ordered    cefadroxil (DURICEF) 500 MG capsule  2 times daily        10/16/21 1828              Honor Loh [place], PA-C 10/16/21 1843    Gerhard Munch, MD 10/16/21 2333

## 2021-10-16 NOTE — Discharge Instructions (Addendum)
Please take antibiotics twice daily as prescribed.  I would like for you to do warm compresses for the next week 3 times per day.  As long as the wound is draining and not progressing up the arm that should take care of itself.  If you begin spiking fevers or if the wound is still spreading and becoming increasingly swollen, red, and painful I would recommend coming back to the ED for further management.

## 2021-10-16 NOTE — ED Triage Notes (Addendum)
Pt reports he thinks he has a bug bite on left elbow 2 days ago, he reports the bite feels like it is on fire  Erythema and edema noted around wound

## 2022-01-10 HISTORY — PX: ROBOT ASSISTED LAPAROSCOPIC PARTIAL COLECTOMY: SHX6476

## 2022-01-26 ENCOUNTER — Emergency Department (HOSPITAL_COMMUNITY): Payer: Medicare Other

## 2022-01-26 ENCOUNTER — Encounter (HOSPITAL_COMMUNITY): Payer: Self-pay | Admitting: Emergency Medicine

## 2022-01-26 ENCOUNTER — Observation Stay (HOSPITAL_COMMUNITY)
Admission: EM | Admit: 2022-01-26 | Discharge: 2022-01-27 | Disposition: A | Discharge status: Home / Self Care | Payer: Medicare Other | Attending: Internal Medicine | Admitting: Internal Medicine

## 2022-01-26 ENCOUNTER — Observation Stay (HOSPITAL_BASED_OUTPATIENT_CLINIC_OR_DEPARTMENT_OTHER): Payer: Medicare Other

## 2022-01-26 ENCOUNTER — Other Ambulatory Visit: Payer: Self-pay

## 2022-01-26 ENCOUNTER — Observation Stay (HOSPITAL_COMMUNITY): Payer: Medicare Other

## 2022-01-26 ENCOUNTER — Other Ambulatory Visit (HOSPITAL_COMMUNITY): Payer: Self-pay | Admitting: *Deleted

## 2022-01-26 DIAGNOSIS — R42 Dizziness and giddiness: Secondary | ICD-10-CM

## 2022-01-26 DIAGNOSIS — E782 Mixed hyperlipidemia: Secondary | ICD-10-CM | POA: Insufficient documentation

## 2022-01-26 DIAGNOSIS — J45909 Unspecified asthma, uncomplicated: Secondary | ICD-10-CM | POA: Diagnosis not present

## 2022-01-26 DIAGNOSIS — Z79899 Other long term (current) drug therapy: Secondary | ICD-10-CM | POA: Insufficient documentation

## 2022-01-26 DIAGNOSIS — E6609 Other obesity due to excess calories: Secondary | ICD-10-CM

## 2022-01-26 DIAGNOSIS — R079 Chest pain, unspecified: Secondary | ICD-10-CM | POA: Diagnosis not present

## 2022-01-26 DIAGNOSIS — R0602 Shortness of breath: Secondary | ICD-10-CM

## 2022-01-26 DIAGNOSIS — E538 Deficiency of other specified B group vitamins: Secondary | ICD-10-CM | POA: Insufficient documentation

## 2022-01-26 DIAGNOSIS — E441 Mild protein-calorie malnutrition: Secondary | ICD-10-CM | POA: Insufficient documentation

## 2022-01-26 DIAGNOSIS — E876 Hypokalemia: Secondary | ICD-10-CM | POA: Insufficient documentation

## 2022-01-26 DIAGNOSIS — E8809 Other disorders of plasma-protein metabolism, not elsewhere classified: Secondary | ICD-10-CM | POA: Diagnosis not present

## 2022-01-26 DIAGNOSIS — H538 Other visual disturbances: Principal | ICD-10-CM | POA: Diagnosis present

## 2022-01-26 DIAGNOSIS — H547 Unspecified visual loss: Secondary | ICD-10-CM

## 2022-01-26 DIAGNOSIS — R519 Headache, unspecified: Secondary | ICD-10-CM | POA: Insufficient documentation

## 2022-01-26 DIAGNOSIS — Z6833 Body mass index (BMI) 33.0-33.9, adult: Secondary | ICD-10-CM | POA: Diagnosis not present

## 2022-01-26 DIAGNOSIS — I10 Essential (primary) hypertension: Secondary | ICD-10-CM | POA: Insufficient documentation

## 2022-01-26 DIAGNOSIS — E669 Obesity, unspecified: Secondary | ICD-10-CM | POA: Diagnosis present

## 2022-01-26 DIAGNOSIS — R770 Abnormality of albumin: Secondary | ICD-10-CM | POA: Diagnosis not present

## 2022-01-26 DIAGNOSIS — E46 Unspecified protein-calorie malnutrition: Secondary | ICD-10-CM

## 2022-01-26 DIAGNOSIS — G4733 Obstructive sleep apnea (adult) (pediatric): Secondary | ICD-10-CM | POA: Diagnosis present

## 2022-01-26 HISTORY — DX: Diverticulosis of intestine, part unspecified, without perforation or abscess without bleeding: K57.90

## 2022-01-26 LAB — RAPID URINE DRUG SCREEN, HOSP PERFORMED
Amphetamines: NOT DETECTED
Barbiturates: NOT DETECTED
Benzodiazepines: NOT DETECTED
Cocaine: NOT DETECTED
Opiates: NOT DETECTED
Tetrahydrocannabinol: NOT DETECTED

## 2022-01-26 LAB — DIFFERENTIAL
Abs Immature Granulocytes: 0.02 10*3/uL (ref 0.00–0.07)
Basophils Absolute: 0.1 10*3/uL (ref 0.0–0.1)
Basophils Relative: 1 %
Eosinophils Absolute: 0.2 10*3/uL (ref 0.0–0.5)
Eosinophils Relative: 2 %
Immature Granulocytes: 0 %
Lymphocytes Relative: 26 %
Lymphs Abs: 2.7 10*3/uL (ref 0.7–4.0)
Monocytes Absolute: 1 10*3/uL (ref 0.1–1.0)
Monocytes Relative: 10 %
Neutro Abs: 6.5 10*3/uL (ref 1.7–7.7)
Neutrophils Relative %: 61 %

## 2022-01-26 LAB — URINALYSIS, ROUTINE W REFLEX MICROSCOPIC
Bilirubin Urine: NEGATIVE
Glucose, UA: NEGATIVE mg/dL
Hgb urine dipstick: NEGATIVE
Ketones, ur: NEGATIVE mg/dL
Leukocytes,Ua: NEGATIVE
Nitrite: NEGATIVE
Protein, ur: NEGATIVE mg/dL
Specific Gravity, Urine: <1.005 — ABNORMAL LOW (ref 1.005–1.030)
pH: 7 (ref 5.0–8.0)

## 2022-01-26 LAB — LIPID PANEL
Cholesterol: 150 mg/dL (ref 0–200)
HDL: 34 mg/dL — ABNORMAL LOW (ref >40)
LDL Cholesterol: 93 mg/dL (ref 0–99)
Total CHOL/HDL Ratio: 4.4 RATIO
Triglycerides: 113 mg/dL (ref <150)
VLDL: 23 mg/dL (ref 0–40)

## 2022-01-26 LAB — COMPREHENSIVE METABOLIC PANEL
ALT: 23 U/L (ref 0–44)
AST: 17 U/L (ref 15–41)
Albumin: 3.3 g/dL — ABNORMAL LOW (ref 3.5–5.0)
Alkaline Phosphatase: 58 U/L (ref 38–126)
Anion gap: 9 (ref 5–15)
BUN: 18 mg/dL (ref 6–20)
CO2: 22 mmol/L (ref 22–32)
Calcium: 8.2 mg/dL — ABNORMAL LOW (ref 8.9–10.3)
Chloride: 103 mmol/L (ref 98–111)
Creatinine, Ser: 0.95 mg/dL (ref 0.61–1.24)
GFR, Estimated: >60 mL/min (ref >60)
Glucose, Bld: 90 mg/dL (ref 70–99)
Potassium: 3.1 mmol/L — ABNORMAL LOW (ref 3.5–5.1)
Sodium: 134 mmol/L — ABNORMAL LOW (ref 135–145)
Total Bilirubin: 0.4 mg/dL (ref 0.3–1.2)
Total Protein: 6.3 g/dL — ABNORMAL LOW (ref 6.5–8.1)

## 2022-01-26 LAB — ECHOCARDIOGRAM COMPLETE BUBBLE STUDY
AR max vel: 2.85 cm2
AV Area VTI: 3.03 cm2
AV Area mean vel: 2.71 cm2
AV Mean grad: 3.9 mmHg
AV Peak grad: 8.1 mmHg
Ao pk vel: 1.42 m/s
Area-P 1/2: 4.24 cm2
S' Lateral: 3.6 cm

## 2022-01-26 LAB — CBC
HCT: 42.4 % (ref 39.0–52.0)
Hemoglobin: 14.6 g/dL (ref 13.0–17.0)
MCH: 30 pg (ref 26.0–34.0)
MCHC: 34.4 g/dL (ref 30.0–36.0)
MCV: 87.1 fL (ref 80.0–100.0)
Platelets: 270 10*3/uL (ref 150–400)
RBC: 4.87 MIL/uL (ref 4.22–5.81)
RDW: 13.2 % (ref 11.5–15.5)
WBC: 10.5 10*3/uL (ref 4.0–10.5)
nRBC: 0 % (ref 0.0–0.2)

## 2022-01-26 LAB — CBG MONITORING, ED: Glucose-Capillary: 94 mg/dL (ref 70–99)

## 2022-01-26 LAB — MAGNESIUM: Magnesium: 2.1 mg/dL (ref 1.7–2.4)

## 2022-01-26 LAB — PHOSPHORUS: Phosphorus: 3.1 mg/dL (ref 2.5–4.6)

## 2022-01-26 LAB — VITAMIN B12: Vitamin B-12: 160 pg/mL — ABNORMAL LOW (ref 180–914)

## 2022-01-26 LAB — HIV ANTIBODY (ROUTINE TESTING W REFLEX): HIV Screen 4th Generation wRfx: NONREACTIVE

## 2022-01-26 LAB — TROPONIN I (HIGH SENSITIVITY)
Troponin I (High Sensitivity): 3 ng/L (ref <18)
Troponin I (High Sensitivity): 3 ng/L (ref <18)

## 2022-01-26 LAB — APTT: aPTT: 27 seconds (ref 24–36)

## 2022-01-26 LAB — PROTIME-INR
INR: 1.1 (ref 0.8–1.2)
Prothrombin Time: 13.8 seconds (ref 11.4–15.2)

## 2022-01-26 LAB — HEMOGLOBIN A1C
Hgb A1c MFr Bld: 5.2 % (ref 4.8–5.6)
Mean Plasma Glucose: 102.54 mg/dL

## 2022-01-26 LAB — TSH: TSH: 3.745 u[IU]/mL (ref 0.350–4.500)

## 2022-01-26 LAB — ETHANOL: Alcohol, Ethyl (B): <10 mg/dL (ref <10)

## 2022-01-26 MED ORDER — DIPHENHYDRAMINE HCL 50 MG/ML IJ SOLN
25.0000 mg | Freq: Once | INTRAMUSCULAR | Status: AC
  Administered 2022-01-26: 25 mg via INTRAVENOUS
  Filled 2022-01-26: qty 1

## 2022-01-26 MED ORDER — ACETAMINOPHEN 650 MG RE SUPP: 650.0000 mg | Freq: Four times a day (QID) | RECTAL | Status: DC | PRN

## 2022-01-26 MED ORDER — ONDANSETRON HCL 4 MG/2ML IJ SOLN
4.0000 mg | Freq: Once | INTRAMUSCULAR | Status: AC
  Administered 2022-01-26: 4 mg via INTRAVENOUS
  Filled 2022-01-26: qty 2

## 2022-01-26 MED ORDER — ACETAMINOPHEN 325 MG PO TABS: 650.0000 mg | ORAL_TABLET | Freq: Four times a day (QID) | ORAL | Status: DC | PRN

## 2022-01-26 MED ORDER — ENSURE ENLIVE PO LIQD
237.0000 mL | Freq: Two times a day (BID) | ORAL | Status: DC
  Administered 2022-01-27: 237 mL via ORAL
  Filled 2022-01-26 (×6): qty 237

## 2022-01-26 MED ORDER — MECLIZINE HCL 12.5 MG PO TABS
50.0000 mg | ORAL_TABLET | Freq: Once | ORAL | Status: AC
  Administered 2022-01-26: 50 mg via ORAL
  Filled 2022-01-26: qty 4

## 2022-01-26 MED ORDER — ONDANSETRON HCL 4 MG PO TABS: 4.0000 mg | ORAL_TABLET | Freq: Four times a day (QID) | ORAL | Status: DC | PRN

## 2022-01-26 MED ORDER — PANTOPRAZOLE SODIUM 40 MG PO TBEC
40.0000 mg | DELAYED_RELEASE_TABLET | Freq: Every day | ORAL | Status: DC
  Administered 2022-01-26 – 2022-01-27 (×2): 40 mg via ORAL
  Filled 2022-01-26 (×2): qty 1

## 2022-01-26 MED ORDER — ONDANSETRON HCL 4 MG/2ML IJ SOLN: 4.0000 mg | Freq: Four times a day (QID) | INTRAMUSCULAR | Status: DC | PRN

## 2022-01-26 MED ORDER — PROCHLORPERAZINE EDISYLATE 10 MG/2ML IJ SOLN
10.0000 mg | Freq: Once | INTRAMUSCULAR | Status: AC
  Administered 2022-01-26: 10 mg via INTRAVENOUS
  Filled 2022-01-26: qty 2

## 2022-01-26 MED ORDER — ASPIRIN 325 MG PO TABS
325.0000 mg | ORAL_TABLET | Freq: Once | ORAL | Status: AC
  Administered 2022-01-26: 325 mg via ORAL
  Filled 2022-01-26: qty 1

## 2022-01-26 MED ORDER — ATORVASTATIN CALCIUM 40 MG PO TABS
40.0000 mg | ORAL_TABLET | Freq: Every day | ORAL | Status: DC
  Administered 2022-01-26 – 2022-01-27 (×2): 40 mg via ORAL
  Filled 2022-01-26 (×2): qty 1

## 2022-01-26 MED ORDER — IOHEXOL 350 MG/ML SOLN
150.0000 mL | Freq: Once | INTRAVENOUS | Status: AC | PRN
  Administered 2022-01-26: 150 mL via INTRAVENOUS

## 2022-01-26 MED ORDER — ASPIRIN 325 MG PO TABS: 325.0000 mg | ORAL_TABLET | Freq: Every day | ORAL | Status: DC

## 2022-01-26 MED ORDER — CYANOCOBALAMIN 1000 MCG/ML IJ SOLN
1000.0000 ug | Freq: Every day | INTRAMUSCULAR | Status: DC
  Administered 2022-01-26 – 2022-01-27 (×2): 1000 ug via INTRAMUSCULAR
  Filled 2022-01-26 (×2): qty 1

## 2022-01-26 MED ORDER — POTASSIUM CHLORIDE CRYS ER 20 MEQ PO TBCR
40.0000 meq | EXTENDED_RELEASE_TABLET | Freq: Once | ORAL | Status: AC
  Administered 2022-01-26: 40 meq via ORAL
  Filled 2022-01-26: qty 2

## 2022-01-26 NOTE — ED Notes (Signed)
Patient transported to MRI 

## 2022-01-26 NOTE — Evaluation (Signed)
Occupational Therapy Evaluation Patient Details Name: William Mcdonald MRN: [id number] DOB: 15-Jul-1975 Today's Date: 01/26/2022   History of Present Illness William Mcdonald is a 47 y.o. male with medical history significant of OSA, obesity who presents to the emergency department due to sudden onset of lightheadedness which occurred while he was driving his car, patient pulled over to the side of the road due to the lightheadedness not going away, he then developed midsternal chest pain which was described as pressure-like and was associated with a headache and blurry vision.  EMS was activated and patient was sent to the ED for further evaluation and management. (per DO)   Clinical Impression   Pt agreeable to OT and PT co-evaluation. Pt reports symptoms that brought him into the hospital have resolved. Pt appears to be at baseline levels for strength and functional ADL's. Pt was able to ambulate in hall without difficulty. No signs of unilateral weakness or continued vision deficits. Pt is not recommended for further acute OT services and will be discharged to care of nursing staff for remaining length of stay.     Recommendations for follow up therapy are one component of a multi-disciplinary discharge planning process, led by the attending physician.  Recommendations may be updated based on patient status, additional functional criteria and insurance authorization.   Follow Up Recommendations  No OT follow up     Assistance Recommended at Discharge None  Patient can return home with the following      Functional Status Assessment  Patient has not had a recent decline in their functional status  Equipment Recommendations  None recommended by OT    Recommendations for Other Services       Precautions / Restrictions Precautions Precautions: None Restrictions Weight Bearing Restrictions: No      Mobility Bed Mobility Overal bed mobility: Independent                   Transfers Overall transfer level: Independent                        Balance Overall balance assessment: Independent                                         ADL either performed or assessed with clinical judgement   ADL Overall ADL's : Independent                                       General ADL Comments: Able to doff and don socks indepednently at EOB.     Vision Baseline Vision/History: 0 No visual deficits Ability to See in Adequate Light: 0 Adequate Patient Visual Report: Blurring of vision;Other (comment) (Which has resolved per pt report.) Vision Assessment?: No apparent visual deficits                Pertinent Vitals/Pain Pain Assessment Pain Assessment: No/denies pain     Hand Dominance Right   Extremity/Trunk Assessment Upper Extremity Assessment Upper Extremity Assessment: Overall WFL for tasks assessed   Lower Extremity Assessment Lower Extremity Assessment: Defer to PT evaluation   Cervical / Trunk Assessment Cervical / Trunk Assessment: Normal   Communication Communication Communication: No difficulties   Cognition Arousal/Alertness: Awake/alert Behavior During Therapy: WFL for tasks assessed/performed Overall Cognitive  Status: Within Functional Limits for tasks assessed                                                        Home Living Family/patient expects to be discharged to:: Private residence Living Arrangements: Children Available Help at Discharge: Available PRN/intermittently Type of Home: Mobile home Home Access: Stairs to enter Entrance Stairs-Number of Steps: 5 Entrance Stairs-Rails: Can reach both Home Layout: One level     Bathroom Shower/Tub: Teacher, early years/pre: Standard Bathroom Accessibility: No   Home Equipment: None          Prior Functioning/Environment Prior Level of Function : Independent/Modified Independent              Mobility Comments: Indepenent community ambulator. ADLs Comments: Independent; drives                                Co-evaluation PT/OT/SLP Co-Evaluation/Treatment: Yes Reason for Co-Treatment: To address functional/ADL transfers   OT goals addressed during session: ADL's and self-care      AM-PAC OT "6 Clicks" Daily Activity     Outcome Measure Help from another person eating meals?: None Help from another person taking care of personal grooming?: None Help from another person toileting, which includes using toliet, bedpan, or urinal?: None Help from another person bathing (including washing, rinsing, drying)?: None Help from another person to put on and taking off regular upper body clothing?: None Help from another person to put on and taking off regular lower body clothing?: None 6 Click Score: 24   End of Session    Activity Tolerance: Patient tolerated treatment well Patient left: in bed;with call bell/phone within reach  OT Visit Diagnosis: Low vision, both eyes (H54.2);Other symptoms and signs involving the nervous system (G62.694)                Time: 8546-2703 OT Time Calculation (min): 9 min Charges:  OT General Charges $OT Visit: 1 Visit OT Evaluation $OT Eval Low Complexity: 1 Low  Monita Swier OT, MOT   Larey Seat 01/26/2022, 9:15 AM

## 2022-01-26 NOTE — ED Notes (Signed)
Code stroke initiated by EDP
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
## 2022-01-26 NOTE — ED Notes (Signed)
ED Provider at bedside. 
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
## 2022-01-26 NOTE — Progress Notes (Signed)
Code stroke  Call time  0017 Beeper   0017 Start   0021 End   [place]   [place] [facility]   [phone]
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
## 2022-01-26 NOTE — Progress Notes (Signed)
  Transition of Care [facility]) Screening Note   Patient Details  Name: William Mcdonald Date of Birth: 01-Mar-1975   Transition of Care [facility]) CM/SW Contact:    Ihor Gully, LCSW Phone Number: 01/26/2022, 11:52 AM    Transition of Care Department [facility]) has reviewed patient and no TOC needs have been identified at this time. We will continue to monitor patient advancement through interdisciplinary progression rounds. If new patient transition needs arise, please place a TOC consult.

## 2022-01-26 NOTE — ED Notes (Signed)
0017 Code Stroke cart activated- LKW 2315, HA, SOB-lightheaded, blurry vision. BP 158/103. H/O diverticulitis, HTN, HLD 0019 pt to CT 46 TS paged 0037 Dr Charlestine Night on camera [phone] pt back from [place] pt states bloody stools last few days so no TNK  Letta Kocher, Scientist, research (physical sciences)

## 2022-01-26 NOTE — Progress Notes (Signed)
Seen and examined; admitted after midnight secondary to dizziness/vertigo and giddiness along with blurred vision; so for CT head and CT angiogram head and neck demonstrating no acute hemorrhagic changes and no large vessel occlusion; patient also have had CT angio chest abdomen and pelvis demonstrating no signs of acute dissection/aneurysm or pulmonary embolism.  Patient is currently hemodynamically stable and has been admitted to the hospital to complete a stroke workup and follow any further recommendation by neurology service.  Please refer to H&P written by Dr. Josephine Cables on 01/26/2022 for further info/details on admission.  Plan: -Daily full dose aspirin recommended by tele-neuro. -Given LDL above 70 patient will be initiated on statin. -Continue to allow permissive hypertension -Follow 2D echo and MRI results -B12 found to be low and repletion will be initiated. -Follow recommendation by speech therapy, PT and OT -Follow clinical response.  Barton Dubois MD [phone]

## 2022-01-26 NOTE — ED Notes (Signed)
Pt ambulated to the bathroom unassisted and back to room. Pt states that headache is gone and vision "seems back to normal"

## 2022-01-26 NOTE — Progress Notes (Signed)
  Echocardiogram 2D Echocardiogram has been performed.  [name] 01/26/2022, 2:04 PM
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
## 2022-01-27 DIAGNOSIS — R42 Dizziness and giddiness: Secondary | ICD-10-CM | POA: Diagnosis not present

## 2022-01-27 DIAGNOSIS — G4733 Obstructive sleep apnea (adult) (pediatric): Secondary | ICD-10-CM | POA: Diagnosis not present

## 2022-01-27 DIAGNOSIS — K219 Gastro-esophageal reflux disease without esophagitis: Secondary | ICD-10-CM

## 2022-01-27 DIAGNOSIS — H538 Other visual disturbances: Secondary | ICD-10-CM | POA: Diagnosis not present

## 2022-01-27 DIAGNOSIS — I10 Essential (primary) hypertension: Secondary | ICD-10-CM

## 2022-01-27 DIAGNOSIS — E876 Hypokalemia: Secondary | ICD-10-CM | POA: Diagnosis not present

## 2022-01-27 LAB — CBC
HCT: 45.4 % (ref 39.0–52.0)
Hemoglobin: 15.3 g/dL (ref 13.0–17.0)
MCH: 30 pg (ref 26.0–34.0)
MCHC: 33.7 g/dL (ref 30.0–36.0)
MCV: 89 fL (ref 80.0–100.0)
Platelets: 283 10*3/uL (ref 150–400)
RBC: 5.1 MIL/uL (ref 4.22–5.81)
RDW: 13.2 % (ref 11.5–15.5)
WBC: 11.3 10*3/uL — ABNORMAL HIGH (ref 4.0–10.5)
nRBC: 0 % (ref 0.0–0.2)

## 2022-01-27 LAB — BASIC METABOLIC PANEL
Anion gap: 9 (ref 5–15)
BUN: 20 mg/dL (ref 6–20)
CO2: 25 mmol/L (ref 22–32)
Calcium: 8.5 mg/dL — ABNORMAL LOW (ref 8.9–10.3)
Chloride: 105 mmol/L (ref 98–111)
Creatinine, Ser: 1.17 mg/dL (ref 0.61–1.24)
GFR, Estimated: >60 mL/min (ref >60)
Glucose, Bld: 91 mg/dL (ref 70–99)
Potassium: 3.5 mmol/L (ref 3.5–5.1)
Sodium: 139 mmol/L (ref 135–145)

## 2022-01-27 MED ORDER — SODIUM CHLORIDE 0.9 % IV BOLUS
1000.0000 mL | Freq: Once | INTRAVENOUS | Status: AC
  Administered 2022-01-27: 1000 mL via INTRAVENOUS

## 2022-01-27 MED ORDER — CYANOCOBALAMIN 1000 MCG/ML IJ SOLN: INTRAMUSCULAR | 0 refills | Status: DC

## 2022-01-27 MED ORDER — PANTOPRAZOLE SODIUM 40 MG PO TBEC: 40.0000 mg | DELAYED_RELEASE_TABLET | Freq: Every day | ORAL | 1 refills | Status: DC

## 2022-01-27 MED ORDER — ATORVASTATIN CALCIUM 20 MG PO TABS: 40.0000 mg | ORAL_TABLET | Freq: Every day | ORAL | 1 refills | Status: DC

## 2022-01-27 NOTE — Care Management Obs Status (Signed)
[place] NOTIFICATION   Patient Details  Name: William Mcdonald MRN: [id number] Date of Birth: [birth year]   Medicare Observation Status Notification Given:  Yes (copy mailed to address on file)    Tommy Medal 01/27/2022, 1:39 PM

## 2022-01-27 NOTE — Discharge Summary (Signed)
Physician Discharge Summary   Patient: William Mcdonald MRN: [id number] DOB: 06-26-1975  Admit date:     01/26/2022  Discharge date: 01/27/22  Discharge Physician: Barton Dubois   PCP: Adaline Sill, NP   Recommendations at discharge:  Repeat CBC to follow hemoglobin trend Repeat basic metabolic panel to follow electrolytes and renal function Repeat B12 level in 4-28-month to further determine the need of further repletion. Close monitoring of patient's lipid panel and LFTs; patient has been initiated on a statin. Reassess blood pressure and adjust/restart antihypertensive medications as required.  Discharge Diagnoses: Principal Problem:   Blurry vision Active Problems:   Obese   OSA (obstructive sleep apnea)   Hypokalemia   Hypoalbuminemia due to protein-calorie malnutrition [facility]) Mixed Hyperlipidemia  Hospital Course: As per H&P written by Dr. Josephine Cables on 01/26/2022 William Mcdonald is a 47 y.o. male with medical history significant of OSA, obesity who presents to the emergency department due to sudden onset of lightheadedness which occurred while he was driving his car, patient pulled over to the side of the road due to the lightheadedness not going away, he then developed midsternal chest pain which was described as pressure-like and was associated with a headache and blurry vision.  EMS was activated and patient was sent to the ED for further evaluation and management.   ED Course: In the emergency department, he was hemodynamically stable on arrival to the ED.  Workup in the ED showed normal CBC, BMP was normal except for sodium 134, potassium 3.1, albumin 3.3, alcohol level was less than 10, urinalysis was normal, urine drug screen was normal, troponin x 2 was flat at 3. CT head without contrast showed no evidence of acute intracranial abnormality CT angiography of head and neck showed no intracranial large vessel occlusion or significant stenosis.  No hemodynamically significant  stenosis in the neck. CT angiography chest showed no evidence of aortic dissection.  No sizable pulmonary emboli are noted CT angiography of abdomen and pelvis showed no evidence of aneurysmal dilatation or dissection Aspirin 325 mg aspirin was given, diphenhydramine, meclizine, Zofran and Compazine were given.  Neurologist was consulted and recommended further stroke workup.  Hospitalist was asked to admit patient for further evaluation and management.  Assessment and Plan: 1-Blurred vision/dizziness/vertigo and giddiness -No prior to admission usage of anticoagulation or NSAIDs. -Patient reports underlying history of ongoing intermittent GI bleed due to diverticulosis. -Workup for TIA/stroke negative at the moment -Case discussed with neurology service who has recommended no antiplatelet therapy at this time. -Continue risk factor modifications and avoid hypotension. -Antihypertensive agents will be discontinued at discharge; patient advised to follow heart healthy diet. -Given findings of makes hyperlipidemia low-dose Lipitor on daily basis will be started. -Normal A1c, 2D echo, MRI and CT angiogram of head and neck as part of workup. -B12 found to be low and will be repleted.  2-B12 deficiency -Patient's B12 level of 169; intramuscular repletion will be initiated with instructions for PCP to follow as an outpatient and determine further need of supplementation/maintenance.  3-GERD/underlying history of GI bleed -Discharged on PPI daily.  4-class I obesity -Body mass index is 33.89 kg/m. -Low-calorie diet, portion control and increase physical activity discussed with patient.  5-obstructive sleep apnea -Continue weight loss management and outpatient follow-up with PCP. -Patient reports unable to use BiPAP due to intolerance.  6-hypokalemia -Repleted and within normal limits at discharge -Repeat basic metabolic panel to follow electrolytes stability.  7-hypertension -Blood  pressure has demonstrated to be  stable to soft with episodes of orthostasis component -All antihypertensive agent has been discontinued at discharge at this moment -Heart healthy diet has been recommended -Follow-up with PCP to reassess blood pressure and further adjust medications as needed.   Consultants: Neurology service Procedures performed: See below for x-ray reports Disposition: Home Diet recommendation: Heart healthy/low calorie diet.  DISCHARGE MEDICATION: Allergies as of 01/27/2022   No Known Allergies      Medication List     STOP taking these medications    amLODipine 5 MG tablet Commonly known as: NORVASC   cefadroxil 500 MG capsule Commonly known as: DURICEF   fluticasone 50 MCG/ACT nasal spray Commonly known as: FLONASE   hydrochlorothiazide 12.5 MG capsule Commonly known as: MICROZIDE   predniSONE 10 MG tablet Commonly known as: DELTASONE       TAKE these medications    atorvastatin 20 MG tablet Commonly known as: LIPITOR Take 2 tablets (40 mg total) by mouth daily.   cyanocobalamin 1000 MCG/ML injection Commonly known as: VITAMIN B12 Inject 1000 mcg daily x 5 days; then weekly x 4 and then monthly after that.   fexofenadine 180 MG tablet Commonly known as: ALLEGRA Take 1 tablet (180 mg total) by mouth daily.   pantoprazole 40 MG tablet Commonly known as: PROTONIX Take 1 tablet (40 mg total) by mouth daily.        Discharge Exam: Filed Weights   01/26/22 0007  Weight: 95.3 kg   General exam: Alert, awake, oriented x 3; no chest pain, no nausea, no vomiting, no shortness of breath.  Currently patient denies any neurologic deficit. Respiratory system: Clear to auscultation. Respiratory effort normal.  Good saturation on room air.  No using accessory muscles. Cardiovascular system:RRR. No rubs or gallops; no JVD appreciated. Gastrointestinal system: Abdomen is obese, nondistended, soft and nontender. No organomegaly or masses felt.  Normal bowel sounds heard. Central nervous system: Alert and oriented. No focal neurological deficits. Extremities: No C/C/E, +pedal pulses Skin: No petechiae. Psychiatry: Judgement and insight appear normal. Mood & affect appropriate.    Condition at discharge: Stable and improved.  The results of significant diagnostics from this hospitalization (including imaging, microbiology, ancillary and laboratory) are listed below for reference.   Imaging Studies: ECHOCARDIOGRAM COMPLETE BUBBLE STUDY  Result Date: 01/26/2022    ECHOCARDIOGRAM REPORT   Patient Name:   William Mcdonald Date of Exam: 01/26/2022 Medical Rec #:  [id number]     Height:       66.0 in Accession #:    [id number]    Weight:       210.0 lb Date of Birth:  Jul 26, 1975     BSA:          2.042 m Patient Age:    81 years      BP:           141/107 mmHg Patient Gender: M             HR:           48 bpm. Exam Location:  [facility] Procedure: 2D Echo, Cardiac Doppler, Color Doppler and Saline Contrast Bubble            Study Indications:    Stroke 434.91 / I63.9  History:        Patient has no prior history of Echocardiogram examinations.                 Signs/Symptoms:Syncope, Shortness of Breath and Chest Pain; Risk  Factors:Non-Smoker, Sleep Apnea, Hypertension and Dyslipidemia.  Sonographer:    Greer Pickerel Referring Phys: [id number] OLADAPO ADEFESO  Sonographer Comments: Image acquisition challenging due to patient body habitus and Image acquisition challenging due to respiratory motion. IMPRESSIONS  1. Left ventricular ejection fraction, by estimation, is 55 to 60%. The left ventricle has normal function. The left ventricle has no regional wall motion abnormalities. Left ventricular diastolic parameters were normal.  2. Right ventricular systolic function is normal. The right ventricular size is normal.  3. The mitral valve is normal in structure. No evidence of mitral valve regurgitation. No evidence of mitral stenosis.  4. The  aortic valve was not well visualized. Aortic valve regurgitation is not visualized. No aortic stenosis is present.  5. The inferior vena cava is normal in size with greater than 50% respiratory variability, suggesting right atrial pressure of 3 mmHg.  6. Agitated saline contrast bubble study was negative, with no evidence of any interatrial shunt. FINDINGS  Left Ventricle: Left ventricular ejection fraction, by estimation, is 55 to 60%. The left ventricle has normal function. The left ventricle has no regional wall motion abnormalities. The left ventricular internal cavity size was normal in size. There is  no left ventricular hypertrophy. Left ventricular diastolic parameters were normal. Right Ventricle: The right ventricular size is normal. Right vetricular wall thickness was not well visualized. Right ventricular systolic function is normal. Left Atrium: Left atrial size was normal in size. Right Atrium: Right atrial size was normal in size. Pericardium: There is no evidence of pericardial effusion. Mitral Valve: The mitral valve is normal in structure. No evidence of mitral valve regurgitation. No evidence of mitral valve stenosis. Tricuspid Valve: The tricuspid valve is normal in structure. Tricuspid valve regurgitation is not demonstrated. No evidence of tricuspid stenosis. Aortic Valve: The aortic valve was not well visualized. Aortic valve regurgitation is not visualized. No aortic stenosis is present. Aortic valve mean gradient measures 3.9 mmHg. Aortic valve peak gradient measures 8.1 mmHg. Aortic valve area, by VTI measures 3.03 cm. Pulmonic Valve: The pulmonic valve was not well visualized. Pulmonic valve regurgitation is not visualized. No evidence of pulmonic stenosis. Aorta: The aortic root is normal in size and structure. Venous: The inferior vena cava is normal in size with greater than 50% respiratory variability, suggesting right atrial pressure of 3 mmHg. IAS/Shunts: No atrial level shunt  detected by color flow Doppler. Agitated saline contrast was given intravenously to evaluate for intracardiac shunting. Agitated saline contrast bubble study was negative, with no evidence of any interatrial shunt.  LEFT VENTRICLE PLAX 2D LVIDd:         5.00 cm   Diastology LVIDs:         3.60 cm   LV e' medial:    7.77 cm/s LV PW:         0.90 cm   LV E/e' medial:  8.7 LV IVS:        0.70 cm   LV e' lateral:   10.60 cm/s LVOT diam:     2.30 cm   LV E/e' lateral: 6.4 LV SV:         89 LV SV Index:   44 LVOT Area:     4.15 cm  RIGHT VENTRICLE RV S prime:     12.60 cm/s TAPSE (M-mode): 2.3 cm LEFT ATRIUM             Index        RIGHT ATRIUM  Index LA diam:        3.80 cm 1.86 cm/m   RA Area:     21.60 cm LA Vol (A2C):   45.1 ml 22.09 ml/m  RA Volume:   64.20 ml  31.44 ml/m LA Vol (A4C):   69.5 ml 34.04 ml/m LA Biplane Vol: 61.2 ml 29.97 ml/m  AORTIC VALVE                    PULMONIC VALVE AV Area (Vmax):    2.85 cm     PR End Diast Vel: 1.95 msec AV Area (Vmean):   2.71 cm AV Area (VTI):     3.03 cm AV Vmax:           141.95 cm/s AV Vmean:          91.534 cm/s AV VTI:            0.295 m AV Peak Grad:      8.1 mmHg AV Mean Grad:      3.9 mmHg LVOT Vmax:         97.40 cm/s LVOT Vmean:        59.600 cm/s LVOT VTI:          0.215 m LVOT/AV VTI ratio: 0.73  AORTA Ao Root diam: 3.80 cm Ao Asc diam:  3.70 cm MITRAL VALVE MV Area (PHT): 4.24 cm    SHUNTS MV Decel Time: 179 msec    Systemic VTI:  0.22 m MV E velocity: 67.80 cm/s  Systemic Diam: 2.30 cm MV A velocity: 53.60 cm/s MV E/A ratio:  1.26 Dina Rich MD Electronically signed by Dina Rich MD Signature Date/Time: 01/26/2022/3:31:11 PM    Final    MR BRAIN WO CONTRAST  Result Date: 01/26/2022 CLINICAL DATA:  Provided history: Neuro deficit, acute, stroke suspected. EXAM: MRI HEAD WITHOUT CONTRAST TECHNIQUE: Multiplanar, multiecho pulse sequences of the brain and surrounding structures were obtained without intravenous contrast.  COMPARISON:  Non-contrast head CT and CT angiogram head/neck 01/26/2022. FINDINGS: Mild intermittent motion degradation. Brain: No age advanced or lobar predominant parenchymal atrophy. Nonspecific 4 mm focus of T2 FLAIR hyperintense signal abnormality within the left parietal lobe white matter (series 18, image 21). Cerebellar tonsillar ectopia (left greater than right) with the left cerebellar tonsil extending 4-5 mm below the level of foramen magnum). No cortical encephalomalacia is identified. There is no acute infarct. No evidence of an intracranial mass. No chronic intracranial blood products. No extra-axial fluid collection. No midline shift. Vascular: Maintained flow voids within the proximal large arterial vessels. Skull and upper cervical spine: No focal suspicious marrow lesion. Sinuses/Orbits: No mass or acute finding within the imaged orbits. Minimal mucosal thickening within the bilateral maxillary sinuses. Small mucous retention cyst within the left maxillary sinus. Mild mucosal thickening scattered within bilateral ethmoid air cells. IMPRESSION: 1. Mildly motion degraded exam. 2. No evidence of acute intracranial abnormality. 3. Nonspecific 4 mm T2 FLAIR hyperintense remote insult within the left parietal lobe white matter. 4. Mild cerebellar tonsillar ectopia (left greater than right). The left cerebellar tonsil extends 4-5 mm below the level of foramen magnum. This is borderline by measurement criteria for a Chiari I malformation. 5. Mild paranasal sinus disease, as described. Electronically Signed   By: Jackey Loge D.O.   On: 01/26/2022 08:02   CT ANGIO HEAD NECK W WO CM  Result Date: 01/26/2022 CLINICAL DATA:  Dizzy, headache EXAM: CT ANGIOGRAPHY HEAD AND NECK TECHNIQUE: Multidetector CT imaging of the head  and neck was performed using the standard protocol during bolus administration of intravenous contrast. Multiplanar CT image reconstructions and MIPs were obtained to evaluate the  vascular anatomy. Carotid stenosis measurements (when applicable) are obtained utilizing NASCET criteria, using the distal internal carotid diameter as the denominator. RADIATION DOSE REDUCTION: This exam was performed according to the departmental dose-optimization program which includes automated exposure control, adjustment of the mA and/or kV according to patient size and/or use of iterative reconstruction technique. CONTRAST:  19mL OMNIPAQUE IOHEXOL 350 MG/ML SOLN COMPARISON:  No prior CTA, correlation is made with CT head 01/26/2022 FINDINGS: CT HEAD FINDINGS For noncontrast findings, please see same day CT head. CTA NECK FINDINGS Aortic arch: Two-vessel arch with a common origin of the brachiocephalic and left common carotid arteries. Imaged portion shows no evidence of aneurysm or dissection. No significant stenosis of the major arch vessel origins. Right carotid system: No evidence of stenosis, dissection, or occlusion. Left carotid system: No evidence of stenosis, dissection, or occlusion. Vertebral arteries: Right dominant system. No evidence of stenosis, dissection, or occlusion. Diminutive left vertebral artery. Skeleton: No acute osseous abnormality. Other neck: Negative. Upper chest: Negative. Review of the MIP images confirms the above findings CTA HEAD FINDINGS Anterior circulation: Both internal carotid arteries are patent to the termini, without significant stenosis. A1 segments patent. Normal anterior communicating artery. Anterior cerebral arteries are patent to their distal aspects. No M1 stenosis or occlusion. MCA branches perfused and symmetric. Posterior circulation: Vertebral arteries patent to the vertebrobasilar junction without stenosis. Posterior inferior cerebellar arteries patent proximally. Basilar patent to its distal aspect. Superior cerebellar arteries patent proximally. Patent P1 segments. PCAs perfused without significant stenosis, although evaluation of the distal PCAs is  limited by venous contamination. The bilateral posterior communicating arteries are patent. Venous sinuses: Patent. Anatomic variants: None significant. Review of the MIP images confirms the above findings IMPRESSION: 1. No intracranial large vessel occlusion or significant stenosis. 2. No hemodynamically significant stenosis in the neck. Electronically Signed   By: Merilyn Baba M.D.   On: 01/26/2022 01:48   CT Angio Chest/Abd/Pel for Dissection W and/or Wo Contrast  Result Date: 01/26/2022 CLINICAL DATA:  Shortness of breath and dizziness, initial encounter EXAM: CT ANGIOGRAPHY CHEST, ABDOMEN AND PELVIS TECHNIQUE: Non-contrast CT of the chest was initially obtained. Multidetector CT imaging through the chest, abdomen and pelvis was performed using the standard protocol during bolus administration of intravenous contrast. Multiplanar reconstructed images and MIPs were obtained and reviewed to evaluate the vascular anatomy. RADIATION DOSE REDUCTION: This exam was performed according to the departmental dose-optimization program which includes automated exposure control, adjustment of the mA and/or kV according to patient size and/or use of iterative reconstruction technique. CONTRAST:  19mL OMNIPAQUE IOHEXOL 350 MG/ML SOLN COMPARISON:  12/21/2020 FINDINGS: CTA CHEST FINDINGS Cardiovascular: Initial precontrast images show no hyperdense crescent to suggest acute aortic injury. Thoracic aorta and its branches are within normal limits. No dissection is seen. Heart is at the upper limits of normal in size. Pulmonary artery shows no large central pulmonary embolus although not timed for embolus evaluation. Mediastinum/Nodes: Thoracic inlet is within normal limits. No hilar or mediastinal adenopathy is noted. The esophagus is within normal limits. Lungs/Pleura: Lungs are well aerated bilaterally. No focal infiltrate or effusion is seen. No sizable parenchymal nodule is noted. Musculoskeletal: No rib abnormality is  noted. Mild degenerative changes of thoracic spine are noted. Review of the MIP images confirms the above findings. CTA ABDOMEN AND PELVIS FINDINGS VASCULAR Aorta: Abdominal aorta  shows no significant atherosclerotic calcification. No aneurysmal dilatation or dissection is noted. Celiac: Patent without evidence of aneurysm, dissection, vasculitis or significant stenosis. SMA: Patent without evidence of aneurysm, dissection, vasculitis or significant stenosis. Renals: Both renal arteries are patent without evidence of aneurysm, dissection, vasculitis, fibromuscular dysplasia or significant stenosis. IMA: Patent without evidence of aneurysm, dissection, vasculitis or significant stenosis. Inflow: Iliacs are within normal limits. No dissection or aneurysmal dilatation is noted. Veins: No obvious venous abnormality within the limitations of this arterial phase study. Review of the MIP images confirms the above findings. NON-VASCULAR Hepatobiliary: Fatty infiltration of the liver is noted. The gallbladder is decompressed. Pancreas: Unremarkable. No pancreatic ductal dilatation or surrounding inflammatory changes. Spleen: Normal in size without focal abnormality. Adrenals/Urinary Tract: Adrenal glands are within normal limits. Kidneys demonstrate a normal excretion pattern bilaterally. Simple cyst is noted in the upper pole of the right kidney measuring 4.6 cm in greatest dimension this is stable from the prior exam. No further follow-up is recommended. Ureters are within normal limits. Bladder is partially distended with opacified and unopacified urine. Stomach/Bowel: Colon shows wall thickening in the region of the sigmoid consistent with focal diverticulitis. The overall appearance however has improved from the prior exam. No abscess or signs of perforation are noted. The more proximal colon is within normal limits. The appendix has been surgically removed. Small bowel and stomach are within normal limits. Lymphatic:  No lymphadenopathy is identified. Reproductive: Prostate is unremarkable. Other: No abdominal wall hernia or abnormality. No abdominopelvic ascites. Musculoskeletal: Prior postsurgical changes are noted in the proximal right femur. No acute bony abnormality is noted. Review of the MIP images confirms the above findings. IMPRESSION: CTA of the chest: No evidence of aortic dissection. No sizable pulmonary emboli are noted. No acute abnormality seen. CTA of the abdomen and pelvis: No evidence of aneurysmal dilatation or dissection. Findings consistent with mild diverticulitis. No abscess or perforation is noted. No other focal abnormality is noted. Electronically Signed   By: Inez Catalina M.D.   On: 01/26/2022 01:02   CT HEAD CODE STROKE WO CONTRAST  Result Date: 01/26/2022 CLINICAL DATA:  Code stroke.  Shortness of breath, dizziness EXAM: CT HEAD WITHOUT CONTRAST TECHNIQUE: Contiguous axial images were obtained from the base of the skull through the vertex without intravenous contrast. RADIATION DOSE REDUCTION: This exam was performed according to the departmental dose-optimization program which includes automated exposure control, adjustment of the mA and/or kV according to patient size and/or use of iterative reconstruction technique. COMPARISON:  03/01/2017 FINDINGS: Brain: No evidence of acute infarction, hemorrhage, mass, mass effect, or midline shift. No hydrocephalus or extra-axial collection. Vascular: No hyperdense vessel. Skull: Negative for fracture or focal lesion. Sinuses/Orbits: Mucosal thickening in the ethmoid air cells. No acute finding in the orbits. Other: The mastoid air cells are well aerated. ASPECTS [facility] Stroke Program Early CT Score) - Ganglionic level infarction (caudate, lentiform nuclei, internal capsule, insula, M1-M3 cortex): 7 - Supraganglionic infarction (M4-M6 cortex): 3 Total score (0-10 with 10 being normal): 10 IMPRESSION: No evidence of acute intracranial abnormality.  ASPECTS is 10. Code stroke imaging results were communicated on 01/26/2022 at 12:36 am to provider [facility] via telephone, who verbally acknowledged these results. Electronically Signed   By: Merilyn Baba M.D.   On: 01/26/2022 00:36    Microbiology: Results for orders placed or performed in visit on 06/08/12  GC/Chlamydia Probe Amp     Status: None   Collection Time: 06/08/12  3:11 PM  Result Value  Ref Range Status   CT Probe RNA NEGATIVE  Final   GC Probe RNA NEGATIVE  Final    Comment:                                                                              Normal Reference Range: Negative         Assay performed using the Gen-Probe APTIMA COMBO2 (R) Assay.   Acceptable specimen types for this assay include APTIMA Swabs (Unisex, endocervical, urethral, or vaginal), first void urine, and ThinPrep liquid based cytology samples.    Labs: CBC: Recent Labs  Lab 01/26/22 0054 01/27/22 0400  WBC 10.5 11.3*  NEUTROABS 6.5  --   HGB 14.6 15.3  HCT 42.4 45.4  MCV 87.1 89.0  PLT 270 166   Basic Metabolic Panel: Recent Labs  Lab 01/26/22 0054 01/26/22 0236 01/27/22 0400  NA 134*  --  139  K 3.1*  --  3.5  CL 103  --  105  CO2 22  --  25  GLUCOSE 90  --  91  BUN 18  --  20  CREATININE 0.95  --  1.17  CALCIUM 8.2*  --  8.5*  MG  --  2.1  --   PHOS  --  3.1  --    Liver Function Tests: Recent Labs  Lab 01/26/22 0054  AST 17  ALT 23  ALKPHOS 58  BILITOT 0.4  PROT 6.3*  ALBUMIN 3.3*   CBG: Recent Labs  Lab 01/26/22 0051  GLUCAP 94    Discharge time spent: greater than 30 minutes.  Signed: Barton Dubois, MD [facility] 01/27/2022

## 2022-02-28 ENCOUNTER — Encounter (HOSPITAL_BASED_OUTPATIENT_CLINIC_OR_DEPARTMENT_OTHER): Payer: Medicare Other | Admitting: Pulmonary Disease

## 2022-02-28 ENCOUNTER — Ambulatory Visit: Payer: Medicare Other | Attending: Internal Medicine | Admitting: Pulmonary Disease

## 2022-02-28 DIAGNOSIS — G4730 Sleep apnea, unspecified: Secondary | ICD-10-CM | POA: Diagnosis present

## 2022-02-28 DIAGNOSIS — R0683 Snoring: Secondary | ICD-10-CM | POA: Diagnosis not present

## 2022-02-28 DIAGNOSIS — G4733 Obstructive sleep apnea (adult) (pediatric): Secondary | ICD-10-CM | POA: Insufficient documentation

## 2022-03-16 DIAGNOSIS — G4730 Sleep apnea, unspecified: Secondary | ICD-10-CM

## 2022-03-16 NOTE — Procedures (Signed)
     Patient Name: William, Mcdonald Date: 02/28/2022 Gender: Male D.O.B: September 25, 1975 Age (years): 46 Referring Provider: Sharlyne Cai NP Height (inches): 60 Interpreting Physician: Chesley Mires MD, ABSM Weight (lbs): 220 RPSGT: Rosebud Poles BMI: 36 MRN: [id number]  CLINICAL INFORMATION Sleep Study Type: HST  Indication for sleep study: Snoring, sleep disruption.  SLEEP STUDY TECHNIQUE A multi-channel overnight portable sleep study was performed. The channels recorded were: nasal airflow, thoracic respiratory movement, and oxygen saturation with a pulse oximetry. Snoring was also monitored.  MEDICATIONS Patient self administered medications include: N/A.  SLEEP ARCHITECTURE Patient was studied for 403 minutes. The sleep efficiency was 84.0 % and the patient was supine for 0%. The arousal index was 0.0 per hour.  RESPIRATORY PARAMETERS The overall AHI was 6.3 per hour, with a central apnea index of 0 per hour.  The oxygen nadir was 92% during sleep.  CARDIAC DATA Mean heart rate during sleep was 53.0 bpm.  IMPRESSIONS - Mild obstructive sleep apnea occurred during this study (AHI = 6.3/h). - The patient had minimal or no oxygen desaturation during the study (Min O2 = 92%) - Patient snored 4.4% during the sleep.  DIAGNOSIS - Obstructive Sleep Apnea (G47.33)  RECOMMENDATIONS - Additional therapies include CPAP, oral appliance, or surgical assessment. - Avoid alcohol, sedatives and other CNS depressants that may worsen sleep apnea and disrupt normal sleep architecture. - Sleep hygiene should be reviewed to assess factors that may improve sleep quality. - Weight management and regular exercise should be initiated or continued. - Sleep medicine consultation available as needed to assist with management.  [Electronically signed] 03/16/2022 08:10 AM  Chesley Mires MD, ABSM Diplomate, American Board of Sleep Medicine NPI: [id number]  [place] PH: [phone]   FX: [phone] [place]

## 2022-09-24 ENCOUNTER — Emergency Department (HOSPITAL_COMMUNITY): Payer: Medicare Other

## 2022-09-24 ENCOUNTER — Other Ambulatory Visit: Payer: Self-pay

## 2022-09-24 ENCOUNTER — Inpatient Hospital Stay (HOSPITAL_COMMUNITY): Payer: Medicare Other | Admitting: Anesthesiology

## 2022-09-24 ENCOUNTER — Encounter (HOSPITAL_COMMUNITY)
Admission: EM | Disposition: A | Discharge status: Home / Self Care | Payer: Self-pay | Source: Home / Self Care | Attending: General Surgery

## 2022-09-24 ENCOUNTER — Encounter (HOSPITAL_COMMUNITY): Payer: Self-pay | Admitting: General Surgery

## 2022-09-24 ENCOUNTER — Inpatient Hospital Stay (HOSPITAL_COMMUNITY)
Admission: EM | Admit: 2022-09-24 | Discharge: 2022-09-30 | DRG: 330 | Disposition: A | Discharge status: Home / Self Care | Payer: Medicare Other | Attending: General Surgery | Admitting: General Surgery

## 2022-09-24 DIAGNOSIS — Z800 Family history of malignant neoplasm of digestive organs: Secondary | ICD-10-CM

## 2022-09-24 DIAGNOSIS — F339 Major depressive disorder, recurrent, unspecified: Secondary | ICD-10-CM | POA: Diagnosis present

## 2022-09-24 DIAGNOSIS — Q438 Other specified congenital malformations of intestine: Secondary | ICD-10-CM | POA: Diagnosis not present

## 2022-09-24 DIAGNOSIS — Z79899 Other long term (current) drug therapy: Secondary | ICD-10-CM

## 2022-09-24 DIAGNOSIS — I10 Essential (primary) hypertension: Secondary | ICD-10-CM | POA: Diagnosis present

## 2022-09-24 DIAGNOSIS — Z833 Family history of diabetes mellitus: Secondary | ICD-10-CM | POA: Diagnosis not present

## 2022-09-24 DIAGNOSIS — J45909 Unspecified asthma, uncomplicated: Secondary | ICD-10-CM | POA: Diagnosis present

## 2022-09-24 DIAGNOSIS — R109 Unspecified abdominal pain: Principal | ICD-10-CM

## 2022-09-24 DIAGNOSIS — K562 Volvulus: Secondary | ICD-10-CM | POA: Diagnosis not present

## 2022-09-24 DIAGNOSIS — G4733 Obstructive sleep apnea (adult) (pediatric): Secondary | ICD-10-CM | POA: Diagnosis present

## 2022-09-24 DIAGNOSIS — Z6833 Body mass index (BMI) 33.0-33.9, adult: Secondary | ICD-10-CM | POA: Diagnosis not present

## 2022-09-24 DIAGNOSIS — K219 Gastro-esophageal reflux disease without esophagitis: Secondary | ICD-10-CM | POA: Diagnosis present

## 2022-09-24 DIAGNOSIS — Z8249 Family history of ischemic heart disease and other diseases of the circulatory system: Secondary | ICD-10-CM

## 2022-09-24 DIAGNOSIS — Z818 Family history of other mental and behavioral disorders: Secondary | ICD-10-CM

## 2022-09-24 DIAGNOSIS — E785 Hyperlipidemia, unspecified: Secondary | ICD-10-CM | POA: Diagnosis present

## 2022-09-24 DIAGNOSIS — K567 Ileus, unspecified: Secondary | ICD-10-CM | POA: Diagnosis not present

## 2022-09-24 DIAGNOSIS — Z83438 Family history of other disorder of lipoprotein metabolism and other lipidemia: Secondary | ICD-10-CM | POA: Diagnosis not present

## 2022-09-24 DIAGNOSIS — E669 Obesity, unspecified: Secondary | ICD-10-CM | POA: Diagnosis present

## 2022-09-24 DIAGNOSIS — F411 Generalized anxiety disorder: Secondary | ICD-10-CM | POA: Diagnosis present

## 2022-09-24 HISTORY — PX: LAPAROTOMY: SHX154

## 2022-09-24 HISTORY — PX: PARTIAL COLECTOMY: SHX5273

## 2022-09-24 LAB — COMPREHENSIVE METABOLIC PANEL
ALT: 119 U/L — ABNORMAL HIGH (ref 0–44)
AST: 72 U/L — ABNORMAL HIGH (ref 15–41)
Albumin: 3.7 g/dL (ref 3.5–5.0)
Alkaline Phosphatase: 92 U/L (ref 38–126)
Anion gap: 8 (ref 5–15)
BUN: 14 mg/dL (ref 6–20)
CO2: 23 mmol/L (ref 22–32)
Calcium: 8.4 mg/dL — ABNORMAL LOW (ref 8.9–10.3)
Chloride: 105 mmol/L (ref 98–111)
Creatinine, Ser: 0.98 mg/dL (ref 0.61–1.24)
GFR, Estimated: >60 mL/min (ref >60)
Glucose, Bld: 109 mg/dL — ABNORMAL HIGH (ref 70–99)
Potassium: 4 mmol/L (ref 3.5–5.1)
Sodium: 136 mmol/L (ref 135–145)
Total Bilirubin: 0.9 mg/dL (ref 0.3–1.2)
Total Protein: 7.6 g/dL (ref 6.5–8.1)

## 2022-09-24 LAB — URINALYSIS, ROUTINE W REFLEX MICROSCOPIC
Bacteria, UA: NONE SEEN
Bilirubin Urine: NEGATIVE
Glucose, UA: NEGATIVE mg/dL
Hgb urine dipstick: NEGATIVE
Ketones, ur: NEGATIVE mg/dL
Leukocytes,Ua: NEGATIVE
Nitrite: NEGATIVE
Protein, ur: 30 mg/dL — AB
Specific Gravity, Urine: 1.024 (ref 1.005–1.030)
pH: 5 (ref 5.0–8.0)

## 2022-09-24 LAB — TYPE AND SCREEN
ABO/RH(D): O NEG
Antibody Screen: NEGATIVE

## 2022-09-24 LAB — CBC
HCT: 45.6 % (ref 39.0–52.0)
Hemoglobin: 15.2 g/dL (ref 13.0–17.0)
MCH: 29.6 pg (ref 26.0–34.0)
MCHC: 33.3 g/dL (ref 30.0–36.0)
MCV: 88.7 fL (ref 80.0–100.0)
Platelets: 304 10*3/uL (ref 150–400)
RBC: 5.14 MIL/uL (ref 4.22–5.81)
RDW: 14.2 % (ref 11.5–15.5)
WBC: 10.7 10*3/uL — ABNORMAL HIGH (ref 4.0–10.5)
nRBC: 0 % (ref 0.0–0.2)

## 2022-09-24 LAB — PROTIME-INR
INR: 1 (ref 0.8–1.2)
Prothrombin Time: 13.8 s (ref 11.4–15.2)

## 2022-09-24 LAB — LIPASE, BLOOD: Lipase: 33 U/L (ref 11–51)

## 2022-09-24 SURGERY — LAPAROTOMY, EXPLORATORY
Anesthesia: General

## 2022-09-24 MED ORDER — SODIUM CHLORIDE 0.9 % IV SOLN
INTRAVENOUS | Status: AC
  Administered 2022-09-25: 2 g via INTRAVENOUS
  Filled 2022-09-24: qty 2

## 2022-09-24 MED ORDER — MIDAZOLAM HCL 5 MG/5ML IJ SOLN
INTRAMUSCULAR | Status: DC | PRN
  Administered 2022-09-24: 2 mg via INTRAVENOUS

## 2022-09-24 MED ORDER — ONDANSETRON HCL 4 MG/2ML IJ SOLN
4.0000 mg | Freq: Once | INTRAMUSCULAR | Status: AC
  Administered 2022-09-24: 4 mg via INTRAVENOUS
  Filled 2022-09-24: qty 2

## 2022-09-24 MED ORDER — IOHEXOL 300 MG/ML  SOLN
100.0000 mL | Freq: Once | INTRAMUSCULAR | Status: AC | PRN
  Administered 2022-09-24: 100 mL via INTRAVENOUS

## 2022-09-24 MED ORDER — BUPIVACAINE HCL (PF) 0.5 % IJ SOLN
INTRAMUSCULAR | Status: AC
  Filled 2022-09-24: qty 30

## 2022-09-24 MED ORDER — LACTATED RINGERS IV SOLN
INTRAVENOUS | Status: DC | PRN
Start: 2022-09-24 — End: 2022-09-24

## 2022-09-24 MED ORDER — CHLORHEXIDINE GLUCONATE CLOTH 2 % EX PADS: 6.0000 | MEDICATED_PAD | Freq: Once | CUTANEOUS | Status: DC

## 2022-09-24 MED ORDER — ONDANSETRON HCL 4 MG/2ML IJ SOLN: 4.0000 mg | Freq: Once | INTRAMUSCULAR | Status: DC | PRN

## 2022-09-24 MED ORDER — DIPHENHYDRAMINE HCL 12.5 MG/5ML PO ELIX: 12.5000 mg | ORAL_SOLUTION | Freq: Four times a day (QID) | ORAL | Status: DC | PRN

## 2022-09-24 MED ORDER — SODIUM CHLORIDE 0.9 % IV SOLN
INTRAVENOUS | Status: AC
  Filled 2022-09-24: qty 2

## 2022-09-24 MED ORDER — SUGAMMADEX SODIUM 200 MG/2ML IV SOLN
INTRAVENOUS | Status: DC | PRN
Start: 2022-09-24 — End: 2022-09-24
  Administered 2022-09-24: 200 mg via INTRAVENOUS

## 2022-09-24 MED ORDER — PROPOFOL 500 MG/50ML IV EMUL
INTRAVENOUS | Status: AC
  Filled 2022-09-24: qty 50

## 2022-09-24 MED ORDER — GLYCOPYRROLATE PF 0.2 MG/ML IJ SOSY
PREFILLED_SYRINGE | INTRAMUSCULAR | Status: DC | PRN
  Administered 2022-09-24: .4 mg via INTRAVENOUS

## 2022-09-24 MED ORDER — MIDAZOLAM HCL 2 MG/2ML IJ SOLN
INTRAMUSCULAR | Status: AC
  Filled 2022-09-24: qty 2

## 2022-09-24 MED ORDER — ZOLPIDEM TARTRATE 5 MG PO TABS
5.0000 mg | ORAL_TABLET | Freq: Every evening | ORAL | Status: DC | PRN
  Administered 2022-09-24: 5 mg via ORAL
  Filled 2022-09-24: qty 1

## 2022-09-24 MED ORDER — GABAPENTIN 300 MG PO CAPS
300.0000 mg | ORAL_CAPSULE | Freq: Two times a day (BID) | ORAL | Status: DC
  Administered 2022-09-24 – 2022-09-30 (×12): 300 mg via ORAL
  Filled 2022-09-24 (×12): qty 1

## 2022-09-24 MED ORDER — METOPROLOL TARTRATE 5 MG/5ML IV SOLN: 5.0000 mg | Freq: Four times a day (QID) | INTRAVENOUS | Status: DC | PRN

## 2022-09-24 MED ORDER — ALUM & MAG HYDROXIDE-SIMETH 200-200-20 MG/5ML PO SUSP
30.0000 mL | Freq: Four times a day (QID) | ORAL | Status: DC | PRN
  Administered 2022-09-29 (×3): 30 mL via ORAL
  Filled 2022-09-24 (×3): qty 30

## 2022-09-24 MED ORDER — DIPHENHYDRAMINE HCL 50 MG/ML IJ SOLN: 12.5000 mg | Freq: Four times a day (QID) | INTRAMUSCULAR | Status: DC | PRN

## 2022-09-24 MED ORDER — PROPOFOL 10 MG/ML IV BOLUS
INTRAVENOUS | Status: AC
  Filled 2022-09-24: qty 20

## 2022-09-24 MED ORDER — OXYCODONE HCL 5 MG PO TABS
5.0000 mg | ORAL_TABLET | ORAL | Status: DC | PRN
  Administered 2022-09-24 – 2022-09-26 (×7): 10 mg via ORAL
  Administered 2022-09-27: 5 mg via ORAL
  Filled 2022-09-24 (×2): qty 2
  Filled 2022-09-24: qty 1
  Filled 2022-09-24 (×2): qty 2
  Filled 2022-09-24: qty 1
  Filled 2022-09-24: qty 2
  Filled 2022-09-24: qty 1
  Filled 2022-09-24: qty 2

## 2022-09-24 MED ORDER — SODIUM CHLORIDE 0.9 % IV SOLN: Freq: Once | INTRAVENOUS | Status: AC

## 2022-09-24 MED ORDER — LACTATED RINGERS IV SOLN: INTRAVENOUS | Status: DC

## 2022-09-24 MED ORDER — SODIUM CHLORIDE 0.9 % IR SOLN
Status: DC | PRN
  Administered 2022-09-24: 1

## 2022-09-24 MED ORDER — FENTANYL CITRATE PF 50 MCG/ML IJ SOSY
25.0000 ug | PREFILLED_SYRINGE | INTRAMUSCULAR | Status: DC | PRN
  Administered 2022-09-24 (×2): 50 ug via INTRAVENOUS

## 2022-09-24 MED ORDER — PROPOFOL 10 MG/ML IV BOLUS
INTRAVENOUS | Status: DC | PRN
  Administered 2022-09-24: 200 mg via INTRAVENOUS

## 2022-09-24 MED ORDER — ACETAMINOPHEN 500 MG PO TABS
1000.0000 mg | ORAL_TABLET | Freq: Four times a day (QID) | ORAL | Status: DC
  Administered 2022-09-25 – 2022-09-30 (×19): 1000 mg via ORAL
  Filled 2022-09-24 (×20): qty 2

## 2022-09-24 MED ORDER — SACCHAROMYCES BOULARDII 250 MG PO CAPS
250.0000 mg | ORAL_CAPSULE | Freq: Two times a day (BID) | ORAL | Status: DC
  Administered 2022-09-24 – 2022-09-30 (×12): 250 mg via ORAL
  Filled 2022-09-24 (×12): qty 1

## 2022-09-24 MED ORDER — ONDANSETRON HCL 4 MG/2ML IJ SOLN
INTRAMUSCULAR | Status: AC
  Filled 2022-09-24: qty 2

## 2022-09-24 MED ORDER — FENTANYL CITRATE PF 50 MCG/ML IJ SOSY
50.0000 ug | PREFILLED_SYRINGE | Freq: Once | INTRAMUSCULAR | Status: AC
  Administered 2022-09-24: 50 ug via INTRAVENOUS
  Filled 2022-09-24: qty 1

## 2022-09-24 MED ORDER — SODIUM CHLORIDE 0.9 % IV SOLN
2.0000 g | Freq: Two times a day (BID) | INTRAVENOUS | Status: AC
  Administered 2022-09-25 – 2022-09-26 (×2): 2 g via INTRAVENOUS
  Filled 2022-09-24 (×4): qty 2

## 2022-09-24 MED ORDER — FENTANYL CITRATE PF 50 MCG/ML IJ SOSY
25.0000 ug | PREFILLED_SYRINGE | INTRAMUSCULAR | Status: DC | PRN
  Filled 2022-09-24 (×2): qty 1

## 2022-09-24 MED ORDER — ATORVASTATIN CALCIUM 40 MG PO TABS
40.0000 mg | ORAL_TABLET | Freq: Every day | ORAL | Status: DC
  Administered 2022-09-25 – 2022-09-30 (×6): 40 mg via ORAL
  Filled 2022-09-24 (×6): qty 1

## 2022-09-24 MED ORDER — FENTANYL CITRATE (PF) 100 MCG/2ML IJ SOLN
INTRAMUSCULAR | Status: AC
  Filled 2022-09-24: qty 2

## 2022-09-24 MED ORDER — SODIUM CHLORIDE 0.9 % IV BOLUS
500.0000 mL | Freq: Once | INTRAVENOUS | Status: AC
  Administered 2022-09-24: 500 mL via INTRAVENOUS

## 2022-09-24 MED ORDER — ROCURONIUM BROMIDE 100 MG/10ML IV SOLN
INTRAVENOUS | Status: DC | PRN
  Administered 2022-09-24: 60 mg via INTRAVENOUS

## 2022-09-24 MED ORDER — BUPIVACAINE HCL (PF) 0.5 % IJ SOLN
INTRAMUSCULAR | Status: DC | PRN
  Administered 2022-09-24: 30 mL

## 2022-09-24 MED ORDER — ONDANSETRON HCL 4 MG PO TABS
4.0000 mg | ORAL_TABLET | Freq: Four times a day (QID) | ORAL | Status: DC | PRN
  Administered 2022-09-29: 4 mg via ORAL
  Filled 2022-09-24: qty 1

## 2022-09-24 MED ORDER — SIMETHICONE 80 MG PO CHEW
40.0000 mg | CHEWABLE_TABLET | Freq: Four times a day (QID) | ORAL | Status: DC | PRN
  Administered 2022-09-29: 40 mg via ORAL
  Filled 2022-09-24: qty 1

## 2022-09-24 MED ORDER — SUCCINYLCHOLINE CHLORIDE 20 MG/ML IJ SOLN
INTRAMUSCULAR | Status: DC | PRN
  Administered 2022-09-24: 100 mg via INTRAVENOUS

## 2022-09-24 MED ORDER — SODIUM CHLORIDE 0.9 % IV SOLN
2.0000 g | INTRAVENOUS | Status: AC
  Administered 2022-09-24: 2 g via INTRAVENOUS
  Filled 2022-09-24: qty 2

## 2022-09-24 MED ORDER — ONDANSETRON HCL 4 MG/2ML IJ SOLN
INTRAMUSCULAR | Status: DC | PRN
  Administered 2022-09-24: 4 mg via INTRAVENOUS

## 2022-09-24 MED ORDER — ENSURE SURGERY PO LIQD
237.0000 mL | Freq: Two times a day (BID) | ORAL | Status: DC
  Administered 2022-09-29 (×2): 237 mL via ORAL
  Filled 2022-09-24 (×14): qty 237

## 2022-09-24 MED ORDER — ENOXAPARIN SODIUM 40 MG/0.4ML IJ SOSY
40.0000 mg | PREFILLED_SYRINGE | INTRAMUSCULAR | Status: DC
  Administered 2022-09-25 – 2022-09-30 (×6): 40 mg via SUBCUTANEOUS
  Filled 2022-09-24 (×6): qty 0.4

## 2022-09-24 MED ORDER — DOCUSATE SODIUM 100 MG PO CAPS
100.0000 mg | ORAL_CAPSULE | Freq: Two times a day (BID) | ORAL | Status: DC
  Administered 2022-09-24 – 2022-09-30 (×11): 100 mg via ORAL
  Filled 2022-09-24 (×11): qty 1

## 2022-09-24 MED ORDER — MORPHINE SULFATE (PF) 2 MG/ML IV SOLN
1.0000 mg | INTRAVENOUS | Status: DC | PRN
  Administered 2022-09-24 – 2022-09-26 (×4): 1 mg via INTRAVENOUS
  Filled 2022-09-24 (×3): qty 1

## 2022-09-24 MED ORDER — FENTANYL CITRATE (PF) 100 MCG/2ML IJ SOLN
INTRAMUSCULAR | Status: DC | PRN
  Administered 2022-09-24: 100 ug via INTRAVENOUS

## 2022-09-24 MED ORDER — ONDANSETRON HCL 4 MG/2ML IJ SOLN
4.0000 mg | Freq: Four times a day (QID) | INTRAMUSCULAR | Status: DC | PRN
  Administered 2022-09-29 – 2022-09-30 (×2): 4 mg via INTRAVENOUS
  Filled 2022-09-24 (×2): qty 2

## 2022-09-24 MED ORDER — KETOROLAC TROMETHAMINE 30 MG/ML IJ SOLN
30.0000 mg | Freq: Four times a day (QID) | INTRAMUSCULAR | Status: AC
  Administered 2022-09-25 (×3): 30 mg via INTRAVENOUS
  Filled 2022-09-24 (×3): qty 1

## 2022-09-24 MED ORDER — PANTOPRAZOLE SODIUM 40 MG PO TBEC
40.0000 mg | DELAYED_RELEASE_TABLET | Freq: Every day | ORAL | Status: DC
  Administered 2022-09-24 – 2022-09-30 (×7): 40 mg via ORAL
  Filled 2022-09-24 (×7): qty 1

## 2022-09-24 SURGICAL SUPPLY — 68 items
APL PRP STRL LF DISP 70% ISPRP (MISCELLANEOUS) ×2
APPLIER CLIP 11 MED OPEN (CLIP)
APPLIER CLIP 13 LRG OPEN (CLIP)
APR CLP LRG 13 20 CLIP (CLIP)
APR CLP MED 11 20 MLT OPN (CLIP)
BARRIER SKIN 2 3/4 (OSTOMY) IMPLANT
BARRIER SKIN OD2.25 2 3/4 FLNG (OSTOMY) IMPLANT
BRR SKN FLT 2.75X2.25 2 PC (OSTOMY)
CHLORAPREP W/TINT 26 (MISCELLANEOUS) ×2 IMPLANT
CLAMP POUCH DRAINAGE QUIET (OSTOMY) IMPLANT
CLIP APPLIE 11 MED OPEN (CLIP) IMPLANT
CLIP APPLIE 13 LRG OPEN (CLIP) IMPLANT
CLOTH BEACON ORANGE TIMEOUT ST (SAFETY) ×2 IMPLANT
COUNTER NDL 20CT MAGNET RED (NEEDLE) IMPLANT
COVER LIGHT HANDLE (MISCELLANEOUS) IMPLANT
COVER LIGHT HANDLE STERIS (MISCELLANEOUS) ×4 IMPLANT
DRAPE 3/4 80X56 (DRAPES) IMPLANT
DRAPE BACK TABLE (DRAPES) IMPLANT
DRAPE UTILITY W/TAPE 26X15 (DRAPES) IMPLANT
DRAPE WARM FLUID 44X44 (DRAPES) ×2 IMPLANT
DRSG OPSITE POSTOP 4X10 (GAUZE/BANDAGES/DRESSINGS) IMPLANT
DRSG OPSITE POSTOP 4X8 (GAUZE/BANDAGES/DRESSINGS) IMPLANT
ELECT BLADE 6 FLAT ULTRCLN (ELECTRODE) IMPLANT
ELECT REM PT RETURN 9FT ADLT (ELECTROSURGICAL) ×4
ELECTRODE REM PT RTRN 9FT ADLT (ELECTROSURGICAL) ×2 IMPLANT
GLOVE BIO SURGEON STRL SZ 6.5 (GLOVE) ×2 IMPLANT
GLOVE BIOGEL PI IND STRL 6.5 (GLOVE) ×2 IMPLANT
GLOVE BIOGEL PI IND STRL 7.0 (GLOVE) ×4 IMPLANT
GOWN STRL REUS W/TWL LRG LVL3 (GOWN DISPOSABLE) ×6 IMPLANT
HANDLE SUCTION POOLE (INSTRUMENTS) ×2 IMPLANT
INST SET MAJOR GENERAL (KITS) ×2 IMPLANT
KIT TURNOVER KIT A (KITS) ×2 IMPLANT
LIGASURE IMPACT 36 18CM CVD LR (INSTRUMENTS) IMPLANT
MANIFOLD NEPTUNE II (INSTRUMENTS) ×2 IMPLANT
NDL HYPO 18GX1.5 BLUNT FILL (NEEDLE) ×2 IMPLANT
NDL HYPO 21X1.5 SAFETY (NEEDLE) ×2 IMPLANT
NEEDLE HYPO 18GX1.5 BLUNT FILL (NEEDLE) ×2 IMPLANT
NEEDLE HYPO 21X1.5 SAFETY (NEEDLE) ×2 IMPLANT
NS IRRIG 1000ML POUR BTL (IV SOLUTION) ×4 IMPLANT
PACK MAJOR ABDOMINAL (CUSTOM PROCEDURE TRAY) ×2 IMPLANT
PAD ARMBOARD 7.5X6 YLW CONV (MISCELLANEOUS) ×2 IMPLANT
PENCIL SMOKE EVACUATOR COATED (MISCELLANEOUS) ×2 IMPLANT
POSITIONER HEAD 8X9X4 ADT (SOFTGOODS) ×2 IMPLANT
POUCH OSTOMY 2 3/4 H 3804 (WOUND CARE)
POUCH OSTOMY 2 PC DRNBL 2.75 (WOUND CARE) IMPLANT
RELOAD LINEAR CUT PROX 55 BLUE (ENDOMECHANICALS) IMPLANT
RELOAD PROXIMATE 75MM BLUE (ENDOMECHANICALS) ×6 IMPLANT
RELOAD STAPLE 55 3.8 BLU REG (ENDOMECHANICALS) IMPLANT
RELOAD STAPLE 75 3.8 BLU REG (ENDOMECHANICALS) IMPLANT
RETRACTOR WND ALEXIS-O 25 LRG (MISCELLANEOUS) IMPLANT
RETRACTOR WOUND ALXS 18CM MED (MISCELLANEOUS) IMPLANT
RTRCTR WOUND ALEXIS O 18CM MED (MISCELLANEOUS) ×2
RTRCTR WOUND ALEXIS O 25CM LRG (MISCELLANEOUS)
SET BASIN LINEN APH (SET/KITS/TRAYS/PACK) ×2 IMPLANT
SPONGE T-LAP 18X18 ~~LOC~~+RFID (SPONGE) ×2 IMPLANT
STAPLER GUN LINEAR PROX 60 (STAPLE) IMPLANT
STAPLER PROXIMATE 55 BLUE (STAPLE) IMPLANT
STAPLER PROXIMATE 75MM BLUE (STAPLE) IMPLANT
STAPLER VISISTAT (STAPLE) ×2 IMPLANT
SUCTION POOLE HANDLE (INSTRUMENTS) ×4
SUT CHROMIC 0 SH (SUTURE) IMPLANT
SUT CHROMIC 2 0 SH (SUTURE) IMPLANT
SUT CHROMIC 3 0 SH 27 (SUTURE) IMPLANT
SUT PDS AB CT VIOLET #0 27IN (SUTURE) ×4 IMPLANT
SUT PROLENE 2 0 SH 30 (SUTURE) IMPLANT
SUT SILK 3 0 SH CR/8 (SUTURE) ×2 IMPLANT
SYR 30ML LL (SYRINGE) ×4 IMPLANT
TRAY FOLEY MTR SLVR 16FR STAT (SET/KITS/TRAYS/PACK) ×2 IMPLANT

## 2022-09-24 NOTE — Anesthesia Postprocedure Evaluation (Signed)
Anesthesia Post Note  Patient: William Mcdonald  Procedure(s) Performed: EXPLORATORY LAPAROTOMY PARTIAL COLECTOMY  Patient location during evaluation: Phase II Anesthesia Type: General Level of consciousness: awake Pain management: pain level controlled Vital Signs Assessment: post-procedure vital signs reviewed and stable Respiratory status: spontaneous breathing and respiratory function stable Cardiovascular status: blood pressure returned to baseline and stable Postop Assessment: no headache and no apparent nausea or vomiting Anesthetic complications: no Comments: Late entry   No notable events documented.   Last Vitals:  Vitals:   09/24/22 1615 09/24/22 1633  BP: 119/87   Pulse: 64   Resp: 15   Temp:  36.5 C  SpO2: 99%     Last Pain:  Vitals:   09/24/22 1633  TempSrc: Oral  PainSc:                  Windell [place]

## 2022-09-24 NOTE — Op Note (Signed)
[facility] Operative Note  09/24/22  Preoperative Diagnosis: Concern for cecal volvulus or internal hernia    Postoperative Diagnosis: Cecal volvulus    Procedure(s) Performed:  Right hemicolectomy   Surgeon: Leatrice Jewels. Henreitta Leber, MD   Assistants: No qualified resident was available    Anesthesia: General endotracheal   Anesthesiologist: Dr.Kiel, MD    Specimens:  Right colon   Estimated Blood Loss: Minimal   Blood Replacement: None    Complications: None   Wound Class: Clean contaminated (no spillage of stool)   Operative Indications: Mr. Sistare is a 47 yo who had abdominal pain and concern for volvulus versus internal hernia involving the cecum on CT scan. We discussed exploration and possible bowel resection, colectomy and risk of bleeding, infection, anastomotic leak, injury to other organs.   Findings: Small bowel twisted overtop of cecum that was volvulized    Procedure: The patient was taken to the operating room and placed supine. General endotracheal anesthesia was induced. Intravenous antibiotics were administered per protocol.  An orogastric tube positioned to decompress the stomach. A foley catheter was placed. The abdomen was prepped and draped in the usual sterile fashion.   A midline incision was made and carried down to the fascia. With care I entered with scissors. A wound protector was placed. The small laid on top and was twisted over the cecum. As I eviscerated the small bowel I noted that it was twisting in a counter clock wise fashion and this revealed the cecum that was also twisted in a counter clock wise position, narrowing a portion of the cecum.  The colon and small bowel were all healthy with no signs of any ischemia.  The colon was very redundant and had a very long mesentery. I could eviscerated the entire right colon. Given this volvulus and redundancy, I opted for a right hemicolectomy.    Towels were laid out. The distal and  proximal points of transection were noted and taken in the standard fashion with a 75 mm linear cutting stapler. A ligasure took the mesentery high near the intestine to prevent any retroperitoneal injury.  The specimen was passed off. Hemostasis was confirmed. The transverse colon was inspected and was noted to be healthy and normal as well as the sigmoid and descending  colon. I then placed the small bowel back into the abdomen in anatomic alignment and the two ends were brought side to side and an antimesenteric anastomosis was performed after creating a colotomy and an enterotomy using a 75 mm linear cutting stapler.  The common colotomy was then closed with a TA 60. Two 3-0 silk crotch sutures were placed. I over sewed the common staple line with Lembert 3-0 silk suture. The mesenteric defect was closed with 3-0 Chromic  gut.  Omentum was placed over the anastomosis. Irrigation was performed. The anastomosis laid in the abdomen without any tension or twisting. The wound protector was removed.   The entire team changed gown and gloves and new closing tray and new drapes were laid down as is protocol. The fascia was closed with 0 PDS suture and the subcutaneous tissue was irrigated. Marcaine was injected. Hemostasis was confirmed. The skin was closed with staples and a honeycomb dressing was placed.   Final inspection revealed acceptable hemostasis. All counts were correct at the end of the case. The patient was awakened from anesthesia and extubated without complication. The foley and OG were removed.   The patient went to the PACU in stable condition.  Algis Greenhouse, MD [facility] [address] [place], [place] [address] [phone] (office)

## 2022-09-24 NOTE — Transfer of Care (Signed)
Immediate Anesthesia Transfer of Care Note  Patient: William Mcdonald  Procedure(s) Performed: EXPLORATORY LAPAROTOMY PARTIAL COLECTOMY  Patient Location: PACU  Anesthesia Type:General  Level of Consciousness: drowsy  Airway & Oxygen Therapy: Patient Spontanous Breathing  Post-op Assessment: Report given to RN and Post -op Vital signs reviewed and stable  Post vital signs: Reviewed and stable  Last Vitals:  Vitals Value Taken Time  BP 151/111 09/24/22 1936  Temp 97   Pulse 86 09/24/22 1937  Resp 15 09/24/22 1937  SpO2 96 % 09/24/22 1937  Vitals shown include unfiled device data.  Last Pain:  Vitals:   09/24/22 1633  TempSrc: Oral  PainSc:          Complications: No notable events documented.

## 2022-09-24 NOTE — ED Notes (Signed)
Patient made aware of NPO status, patient verbalized understanding. Patient stated "Pain has returned to right side." MD notified.

## 2022-09-24 NOTE — ED Provider Notes (Signed)
[place] EMERGENCY DEPARTMENT AT [facility] Provider Note   CSN: [id number] Arrival date & time: 09/24/22  1222     History  Chief Complaint  Patient presents with   Abdominal Pain    William Mcdonald is a 47 y.o. male.  He had prior sigmoid colectomy resection for diverticulitis done back in February.  He said he had some right-sided abdominal pain upper since yesterday.  Had a pop sensation on that side of his abdomen.  Symptoms worsened today.  He has also had 4 episodes of watery diarrhea today.  Nausea no vomiting.  No fevers chills.  No chest pain or shortness of breath.  No sick contacts or recent travel.  Denies any alcohol or tobacco use.  He does say that he has a known multiple hernias although cannot tell me where.  He said they saw him on CAT scan and when they did the surgery.    The history is provided by the patient.  Abdominal Pain Pain location:  RUQ Pain quality: aching   Pain severity:  Severe Onset quality:  Sudden Duration:  2 days Timing:  Constant Progression:  Unchanged Chronicity:  New Relieved by:  Nothing Worsened by:  Nothing Ineffective treatments:  None tried Associated symptoms: diarrhea and nausea   Associated symptoms: no chest pain, no constipation, no cough, no dysuria, no fever and no vomiting        Home Medications Prior to Admission medications   Medication Sig Start Date End Date Taking? Authorizing Provider  atorvastatin (LIPITOR) 20 MG tablet Take 2 tablets (40 mg total) by mouth daily. 01/27/22   Vassie Loll, MD  cyanocobalamin (VITAMIN B12) 1000 MCG/ML injection Inject 1000 mcg daily x 5 days; then weekly x 4 and then monthly after that. 01/27/22   Vassie Loll, MD  fexofenadine (ALLEGRA) 180 MG tablet Take 1 tablet (180 mg total) by mouth daily. 10/16/17 10/16/18  McGowen, Maryjean Morn, MD  pantoprazole (PROTONIX) 40 MG tablet Take 1 tablet (40 mg total) by mouth daily. 01/27/22   Vassie Loll, MD      Allergies     Patient has no known allergies.    Review of Systems   Review of Systems  Constitutional:  Negative for fever.  Respiratory:  Negative for cough.   Cardiovascular:  Negative for chest pain.  Gastrointestinal:  Positive for abdominal pain, diarrhea and nausea. Negative for constipation and vomiting.  Genitourinary:  Negative for dysuria.    Physical Exam Updated Vital Signs BP (!) 147/96 (BP Location: Right Arm)   Pulse 68   Temp 98.8 F (37.1 C) (Oral)   Resp 16   Ht 5\' 6"  (1.676 m)   Wt 93 kg   SpO2 96%   BMI 33.09 kg/m  Physical Exam Vitals and nursing note reviewed.  Constitutional:      General: He is not in acute distress.    Appearance: He is well-developed.  HENT:     Head: Normocephalic and atraumatic.  Eyes:     Conjunctiva/sclera: Conjunctivae normal.  Cardiovascular:     Rate and Rhythm: Normal rate and regular rhythm.     Heart sounds: No murmur heard. Pulmonary:     Effort: Pulmonary effort is normal. No respiratory distress.     Breath sounds: Normal breath sounds.  Abdominal:     Palpations: Abdomen is soft.     Tenderness: There is abdominal tenderness in the right upper quadrant. There is no guarding or rebound.  Comments: He has multiple surgical scars although I do not feel any fascial defects or definite hernias.  Musculoskeletal:        General: No swelling.     Cervical back: Neck supple.  Skin:    General: Skin is warm and dry.     Capillary Refill: Capillary refill takes less than 2 seconds.  Neurological:     General: No focal deficit present.     Mental Status: He is alert.     ED Results / Procedures / Treatments   Labs (all labs ordered are listed, but only abnormal results are displayed) Labs Reviewed  COMPREHENSIVE METABOLIC PANEL - Abnormal; Notable for the following components:      Result Value   Glucose, Bld 109 (*)    Calcium 8.4 (*)    AST 72 (*)    ALT 119 (*)    All other components within normal limits  CBC -  Abnormal; Notable for the following components:   WBC 10.7 (*)    All other components within normal limits  URINALYSIS, ROUTINE W REFLEX MICROSCOPIC - Abnormal; Notable for the following components:   Protein, ur 30 (*)    All other components within normal limits  LIPASE, BLOOD  PROTIME-INR  TYPE AND SCREEN    EKG EKG Interpretation Date/Time:  Saturday September 24 2022 15:48:00 EDT Ventricular Rate:  52 PR Interval:  143 QRS Duration:  112 QT Interval:  471 QTC Calculation: 438 R Axis:   63  Text Interpretation: Sinus rhythm Borderline intraventricular conduction delay rate slower than prior 1/24 Confirmed by Meridee Score [phone]) on 09/24/2022 5:22:43 PM  Radiology CT ABDOMEN PELVIS W CONTRAST  Result Date: 09/24/2022 CLINICAL DATA:  Popping sensation in the right hemiabdomen associated with increasing pain, swelling, and nausea EXAM: CT ABDOMEN AND PELVIS WITH CONTRAST TECHNIQUE: Multidetector CT imaging of the abdomen and pelvis was performed using the standard protocol following bolus administration of intravenous contrast. RADIATION DOSE REDUCTION: This exam was performed according to the departmental dose-optimization program which includes automated exposure control, adjustment of the mA and/or kV according to patient size and/or use of iterative reconstruction technique. CONTRAST:  OMNIPAQUE IOHEXOL 300 MG/ML  SOLN COMPARISON:  CTA abdomen and pelvis dated 01/26/2022 FINDINGS: Lower chest: No focal consolidation or pulmonary nodule in the lung bases. No pleural effusion or pneumothorax demonstrated. Partially imaged heart size is normal. Hepatobiliary: No focal hepatic lesions. No intra or extrahepatic biliary ductal dilation. Normal gallbladder. Pancreas: No focal lesions or main ductal dilation. Spleen: Spleen measures 13.6 cm in the AP dimension, previously 11.1 cm. Adrenals/Urinary Tract: No adrenal nodules. No suspicious renal mass, calculi or hydronephrosis. Right  upper pole simple cyst. No specific follow-up imaging recommended. no focal bladder wall thickening. Stomach/Bowel: Normal appearance of the stomach. Postsurgical changes of the sigmoid colon. Anastomosis is patent. Cecum is located within the left hemiabdomen with an area of marked luminal narrowing in the midline lower abdomen immediately inferior and deep to the umbilicus (5:55). A single loop of mildly dilated small bowel is present within the left upper quadrant adjacent to the gas-filled cecum. Additional loops of small bowel demonstrate fecalization of intraluminal contents. Appendectomy. Vascular/Lymphatic: No significant vascular findings are present. No enlarged abdominal or pelvic lymph nodes. Reproductive: Prostate is unremarkable. Other: No free fluid, fluid collection, or free air. Musculoskeletal: No acute or abnormal lytic or blastic osseous lesions. Multilevel degenerative changes of the partially imaged thoracic and lumbar spine. Postsurgical changes from prior right femoral  fixation. Small fat-containing bilateral inguinal hernias. IMPRESSION: 1. Cecum is newly located within the left hemiabdomen, demonstrating an area of marked luminal narrowing in the midline lower abdomen immediately inferior and deep to the umbilicus, suspicious for high-grade cecal obstruction in this area, possibly related to adhesions, mesenteric defect, or cecal volvulus. 2. A single loop of mildly dilated small bowel is present within the left upper quadrant adjacent to the gas-filled cecum, which may reflect ileus. 3. Interval increase in splenomegaly. These results were called by telephone at the time of interpretation on 09/24/2022 at 3:02 pm to provider [facility] , who verbally acknowledged these results. Electronically Signed   By: Agustin Cree M.D.   On: 09/24/2022 15:02    Procedures Procedures    Medications Ordered in ED Medications  sodium chloride irrigation 0.9 % (1 Application Irrigation Given  09/24/22 1618)  bupivacaine(PF) (MARCAINE) 0.5 % injection (30 mLs Infiltration Given 09/24/22 1656)  Chlorhexidine Gluconate Cloth 2 % PADS 6 each (has no administration in time range)    And  Chlorhexidine Gluconate Cloth 2 % PADS 6 each (has no administration in time range)  cefoTEtan (CEFOTAN) 2 g in sodium chloride 0.9 % 100 mL IVPB (has no administration in time range)  sodium chloride 0.9 % with cefoTEtan (CEFOTAN) ADS Med (has no administration in time range)  sodium chloride 0.9 % bolus 500 mL (0 mLs Intravenous Stopped 09/24/22 1619)  ondansetron (ZOFRAN) injection 4 mg (4 mg Intravenous Given 09/24/22 1352)  fentaNYL (SUBLIMAZE) injection 50 mcg (50 mcg Intravenous Given 09/24/22 1353)  iohexol (OMNIPAQUE) 300 MG/ML solution 100 mL (100 mLs Intravenous Contrast Given 09/24/22 1427)  0.9 %  sodium chloride infusion (0 mLs Intravenous Stopped 09/24/22 1544)    ED Course/ Medical Decision Making/ A&P Clinical Course as of 09/24/22 1728  Sat Sep 24, 2022  1500 Received a call from radiology with some abnormal findings on his CT.  She said it looks like his cecum has flipped over into the left side.  She does not think it is a true volvulus but she wonders if there is a problem in the mesentery with some type of internal hernia. [MB]  1528 Discussed with Henreitta Leber, she is going to get back with me after she can review the scan.  Patient states he has not had anything to eat or drink since 11 last night.  Pain is improved after medications. [MB]    Clinical Course User Index [MB] Terrilee Files, MD                                 Medical Decision Making Amount and/or Complexity of Data Reviewed Labs: ordered. Radiology: ordered.  Risk Prescription drug management. Decision regarding hospitalization.   This patient complains of abdominal pain nausea diarrhea; this involves an extensive number of treatment Options and is a complaint that carries with it a high risk of complications  and morbidity. The differential includes perforation, obstruction, abdominal wall hematoma, hernia  I ordered, reviewed and interpreted labs, which included CBC with mildly elevated white count, chemistries unremarkable, AST and ALT mildly elevated I ordered medication IV fluids IV pain medication nausea medication and reviewed PMP when indicated. I ordered imaging studies which included CT abdomen pelvis and I independently    visualized and interpreted imaging which showed cecal malposition and possible rotation Previous records obtained and reviewed in epic no recent admissions I consulted Dr. Henreitta Leber general  surgery and discussed lab and imaging findings and discussed disposition.  Cardiac monitoring reviewed, sinus rhythm Social determinants considered, no significant barriers Critical Interventions: None  After the interventions stated above, I reevaluated the patient and found patient to be feeling improved and hemodynamically stable Admission and further testing considered, general surgery is talking to him about going to the OR for exploration.         Final Clinical Impression(s) / ED Diagnoses Final diagnoses:  Right sided abdominal pain    Rx / DC Orders ED Discharge Orders     None         Terrilee Files, MD 09/24/22 1730

## 2022-09-24 NOTE — Anesthesia Preprocedure Evaluation (Signed)
Anesthesia Evaluation  Patient identified by MRN, date of birth, ID band Patient awake    Reviewed: Allergy & Precautions, H&P , NPO status , Patient's Chart, lab work & pertinent test results, reviewed documented beta blocker date and time   Airway Mallampati: II  TM Distance: >3 FB Neck ROM: full    Dental no notable dental hx.    Pulmonary neg pulmonary ROS, asthma , sleep apnea    Pulmonary exam normal breath sounds clear to auscultation       Cardiovascular Exercise Tolerance: Good hypertension, negative cardio ROS  Rhythm:regular Rate:Normal     Neuro/Psych  PSYCHIATRIC DISORDERS Anxiety Depression    negative neurological ROS  negative psych ROS   GI/Hepatic negative GI ROS, Neg liver ROS,GERD  ,,  Endo/Other  negative endocrine ROS    Renal/GU negative Renal ROS  negative genitourinary   Musculoskeletal   Abdominal   Peds  Hematology negative hematology ROS (+)   Anesthesia Other Findings   Reproductive/Obstetrics negative OB ROS                             Anesthesia Physical Anesthesia Plan  ASA: 4 and emergent  Anesthesia Plan: General and General ETT   Post-op Pain Management:    Induction:   PONV Risk Score and Plan: Ondansetron  Airway Management Planned:   Additional Equipment:   Intra-op Plan:   Post-operative Plan:   Informed Consent: I have reviewed the patients History and Physical, chart, labs and discussed the procedure including the risks, benefits and alternatives for the proposed anesthesia with the patient or authorized representative who has indicated his/her understanding and acceptance.     Dental Advisory Given  Plan Discussed with: CRNA  Anesthesia Plan Comments:        Anesthesia Quick Evaluation

## 2022-09-24 NOTE — Progress Notes (Addendum)
[facility]  Updated Joni Reining his significant other who patient told me is his Management consultant. Right hemicolectomy for cecal volvulus given risk of recurrence 40-60%.   PRN narcotic for pain Scheduled tylenol and toradol  IS, OOB Telemetry Clear diet Colace Cefotetan for 3 doses as was emergent case and non-prepped bowel LF @ 50 Labs in AM SCDs, lovenox  Algis Greenhouse, MD [facility] [address] [place], [place] [address] [phone] (office)

## 2022-09-24 NOTE — ED Triage Notes (Signed)
Pt stated that something popped in the RIGHT side of stomach Had prior colon surgery Told that he has large hernia Today felt pop again, nausea and increased pain and swelling.   Pt stated he had Bm approx 1 hours ago, "straight water"
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
## 2022-09-24 NOTE — ED Notes (Signed)
Pt to be transported to short stay

## 2022-09-24 NOTE — ED Provider Notes (Signed)
3:36 PM Patient signed out to me by previous ED physician. Pt is a 47 yo male presenting for abdominal pain and diarrhea.   CT demonstrates:  1. Cecum is newly located within the left hemiabdomen, demonstrating an area of marked luminal narrowing in the midline lower abdomen immediately inferior and deep to the umbilicus, suspicious for high-grade cecal obstruction in this area, possibly related to adhesions, mesenteric defect, or cecal volvulus. 2. A single loop of mildly dilated small bowel is present within the left upper quadrant adjacent to the gas-filled cecum, which may reflect ileus. 3. Interval increase in splenomegaly.  Plan: Gen surg taking OR. Pending admission tream.    Physical Exam  BP 126/86   Pulse 65   Temp 98.8 F (37.1 C) (Oral)   Resp 16   Ht 5\' 6"  (1.676 m)   Wt 93 kg   SpO2 96%   BMI 33.09 kg/m   Physical Exam  Procedures  Procedures  ED Course / MDM   Clinical Course as of 09/24/22 1535  Sat Sep 24, 2022  1500 Received a call from radiology with some abnormal findings on his CT.  She said it looks like his cecum has flipped over into the left side.  She does not think it is a true volvulus but she wonders if there is a problem in the mesentery with some type of internal hernia. [MB]  1528 Discussed with Henreitta Leber, she is going to get back with me after she can review the scan.  Patient states he has not had anything to eat or drink since 11 last night.  Pain is improved after medications. [MB]    Clinical Course User Index [MB] Terrilee Files, MD   Medical Decision Making Amount and/or Complexity of Data Reviewed Labs: ordered. Radiology: ordered.  Risk Prescription drug management. Decision regarding hospitalization.   General surgery taking patient to OR.       Franne Forts, DO 09/24/22 Rickey Primus

## 2022-09-25 ENCOUNTER — Encounter (HOSPITAL_COMMUNITY): Payer: Self-pay | Admitting: General Surgery

## 2022-09-25 LAB — CBC
HCT: 40.6 % (ref 39.0–52.0)
Hemoglobin: 13.1 g/dL (ref 13.0–17.0)
MCH: 29.1 pg (ref 26.0–34.0)
MCHC: 32.3 g/dL (ref 30.0–36.0)
MCV: 90.2 fL (ref 80.0–100.0)
Platelets: 268 10*3/uL (ref 150–400)
RBC: 4.5 MIL/uL (ref 4.22–5.81)
RDW: 14.1 % (ref 11.5–15.5)
WBC: 13.3 10*3/uL — ABNORMAL HIGH (ref 4.0–10.5)
nRBC: 0 % (ref 0.0–0.2)

## 2022-09-25 LAB — MAGNESIUM: Magnesium: 1.8 mg/dL (ref 1.7–2.4)

## 2022-09-25 LAB — BASIC METABOLIC PANEL
Anion gap: 9 (ref 5–15)
BUN: 12 mg/dL (ref 6–20)
CO2: 22 mmol/L (ref 22–32)
Calcium: 7.4 mg/dL — ABNORMAL LOW (ref 8.9–10.3)
Chloride: 101 mmol/L (ref 98–111)
Creatinine, Ser: 0.97 mg/dL (ref 0.61–1.24)
GFR, Estimated: >60 mL/min (ref >60)
Glucose, Bld: 143 mg/dL — ABNORMAL HIGH (ref 70–99)
Potassium: 3.7 mmol/L (ref 3.5–5.1)
Sodium: 132 mmol/L — ABNORMAL LOW (ref 135–145)

## 2022-09-25 LAB — PHOSPHORUS: Phosphorus: 2.8 mg/dL (ref 2.5–4.6)

## 2022-09-25 MED ORDER — POTASSIUM CHLORIDE 20 MEQ PO PACK
40.0000 meq | PACK | Freq: Two times a day (BID) | ORAL | Status: DC
  Administered 2022-09-25 (×2): 40 meq via ORAL
  Filled 2022-09-25 (×2): qty 2

## 2022-09-25 NOTE — Progress Notes (Signed)
Pt reported severe pain several times during the night. PRN pain medication given. Pt lethargic and slept through most of the night.   Abdominal binder and ice pack applied to pt. Incentive spirometer given to pt, education provided with teach-back. SCDs on during the night.

## 2022-09-25 NOTE — Progress Notes (Signed)
[facility] Progress Note  1 Day Post-Op  Subjective: Doing well. No Bm. Feels a little distended.   Objective: Vital signs in last 24 hours: Temp:  [97.5 F (36.4 C)-98 F (36.7 C)] 97.7 F (36.5 C) (09/15 0858) Pulse Rate:  [64-86] 72 (09/15 0858) Resp:  [12-21] 12 (09/15 0858) BP: (102-148)/(67-99) 102/74 (09/15 0858) SpO2:  [91 %-99 %] 94 % (09/15 0858) Weight:  [93.7 kg-94.4 kg] 93.7 kg (09/15 0500)    Intake/Output from previous day: 09/14 0701 - 09/15 0700 In: 2420.4 [I.V.:2222.3; IV Piggyback:198.1] Out: 1130 [Urine:1080; Blood:50] Intake/Output this shift: Total I/O In: 491.7 [P.O.:240; I.V.:250; IV Piggyback:1.7] Out: -   General appearance: alert and no distress GI: soft, mildly distended, honeycomb c/di with some staining on bottom, binder in place   Lab Results:  Recent Labs    09/24/22 1234 09/25/22 0527  WBC 10.7* 13.3*  HGB 15.2 13.1  HCT 45.6 40.6  PLT 304 268   BMET Recent Labs    09/24/22 1234 09/25/22 0527  NA 136 132*  K 4.0 3.7  CL 105 101  CO2 23 22  GLUCOSE 109* 143*  BUN 14 12  CREATININE 0.98 0.97  CALCIUM 8.4* 7.4*   PT/INR Recent Labs    09/24/22 1553  LABPROT 13.8  INR 1.0    Studies/Results: CT ABDOMEN PELVIS W CONTRAST  Result Date: 09/24/2022 CLINICAL DATA:  Popping sensation in the right hemiabdomen associated with increasing pain, swelling, and nausea EXAM: CT ABDOMEN AND PELVIS WITH CONTRAST TECHNIQUE: Multidetector CT imaging of the abdomen and pelvis was performed using the standard protocol following bolus administration of intravenous contrast. RADIATION DOSE REDUCTION: This exam was performed according to the departmental dose-optimization program which includes automated exposure control, adjustment of the mA and/or kV according to patient size and/or use of iterative reconstruction technique. CONTRAST:  OMNIPAQUE IOHEXOL 300 MG/ML  SOLN COMPARISON:  CTA abdomen and pelvis dated  01/26/2022 FINDINGS: Lower chest: No focal consolidation or pulmonary nodule in the lung bases. No pleural effusion or pneumothorax demonstrated. Partially imaged heart size is normal. Hepatobiliary: No focal hepatic lesions. No intra or extrahepatic biliary ductal dilation. Normal gallbladder. Pancreas: No focal lesions or main ductal dilation. Spleen: Spleen measures 13.6 cm in the AP dimension, previously 11.1 cm. Adrenals/Urinary Tract: No adrenal nodules. No suspicious renal mass, calculi or hydronephrosis. Right upper pole simple cyst. No specific follow-up imaging recommended. no focal bladder wall thickening. Stomach/Bowel: Normal appearance of the stomach. Postsurgical changes of the sigmoid colon. Anastomosis is patent. Cecum is located within the left hemiabdomen with an area of marked luminal narrowing in the midline lower abdomen immediately inferior and deep to the umbilicus (5:55). A single loop of mildly dilated small bowel is present within the left upper quadrant adjacent to the gas-filled cecum. Additional loops of small bowel demonstrate fecalization of intraluminal contents. Appendectomy. Vascular/Lymphatic: No significant vascular findings are present. No enlarged abdominal or pelvic lymph nodes. Reproductive: Prostate is unremarkable. Other: No free fluid, fluid collection, or free air. Musculoskeletal: No acute or abnormal lytic or blastic osseous lesions. Multilevel degenerative changes of the partially imaged thoracic and lumbar spine. Postsurgical changes from prior right femoral fixation. Small fat-containing bilateral inguinal hernias. IMPRESSION: 1. Cecum is newly located within the left hemiabdomen, demonstrating an area of marked luminal narrowing in the midline lower abdomen immediately inferior and deep to the umbilicus, suspicious for high-grade cecal obstruction in this area, possibly related to adhesions, mesenteric defect, or cecal volvulus. 2.  A single loop of mildly dilated  small bowel is present within the left upper quadrant adjacent to the gas-filled cecum, which may reflect ileus. 3. Interval increase in splenomegaly. These results were called by telephone at the time of interpretation on 09/24/2022 at 3:02 pm to provider [facility] , who verbally acknowledged these results. Electronically Signed   By: Agustin Cree M.D.   On: 09/24/2022 15:02    Anti-infectives: Anti-infectives (From admission, onward)    Start     Dose/Rate Route Frequency Ordered Stop   09/25/22 0600  cefoTEtan (CEFOTAN) 2 g in sodium chloride 0.9 % 100 mL IVPB        2 g 200 mL/hr over 30 Minutes Intravenous On call to O.R. 09/24/22 1700 09/25/22 0537   09/25/22 0600  cefoTEtan (CEFOTAN) 2 g in sodium chloride 0.9 % 100 mL IVPB        2 g 200 mL/hr over 30 Minutes Intravenous Every 12 hours 09/24/22 2029 09/26/22 2159   09/24/22 1706  sodium chloride 0.9 % with cefoTEtan (CEFOTAN) ADS Med       Note to Pharmacy: Elvina Sidle S: cabinet override      09/24/22 1706 09/24/22 1848   09/24/22 1705  sodium chloride 0.9 % with cefOXitin (MEFOXIN) ADS Med  Status:  Discontinued       Note to Pharmacy: Elvina Sidle S: cabinet override      09/24/22 1705 09/24/22 1710       Assessment/Plan: Patient s/p R hemicolectomy for cecal volvulus.  Doing well.  Toradol and tylenol for scheduled Prn narcotics IS, Oob HD ok Clear liquid Colace  SCDs. Lovenox Ambulate Labs in am, k replaced  Cefotetan for 3 doses given nonprepped bowel    LOS: 1 day    Lucretia Roers 09/25/2022

## 2022-09-25 NOTE — TOC CM/SW Note (Signed)
Transition of Care [facility]) - Inpatient Brief Assessment   Patient Details  Name: William Mcdonald MRN: [id number] Date of Birth: [birth year]  [facility] CM/SW Contact:    Villa Herb, LCSWA Phone Number: 09/25/2022, 9:29 AM   Clinical Narrative: Transition of Care Department [facility]) has reviewed patient and no TOC needs have been identified at this time. We will continue to monitor patient advancement through interdisciplinary progression rounds. If new patient transition needs arise, please place a TOC consult.  Transition of Care Asessment: Insurance and Status: Insurance coverage has been reviewed Patient has primary care physician: Yes Home environment has been reviewed: from home Prior level of function:: independent Prior/Current Home Services: No current home services Social Determinants of Health Reivew: SDOH reviewed no interventions necessary Readmission risk has been reviewed: Yes Transition of care needs: no transition of care needs at this time

## 2022-09-26 ENCOUNTER — Encounter (HOSPITAL_COMMUNITY): Payer: Self-pay | Admitting: General Surgery

## 2022-09-26 LAB — PHOSPHORUS: Phosphorus: 1.9 mg/dL — ABNORMAL LOW (ref 2.5–4.6)

## 2022-09-26 LAB — CBC WITH DIFFERENTIAL/PLATELET
Abs Immature Granulocytes: 0.04 10*3/uL (ref 0.00–0.07)
Basophils Absolute: 0.1 10*3/uL (ref 0.0–0.1)
Basophils Relative: 1 %
Eosinophils Absolute: 0.2 10*3/uL (ref 0.0–0.5)
Eosinophils Relative: 2 %
HCT: 41.6 % (ref 39.0–52.0)
Hemoglobin: 13.4 g/dL (ref 13.0–17.0)
Immature Granulocytes: 0 %
Lymphocytes Relative: 33 %
Lymphs Abs: 3.3 10*3/uL (ref 0.7–4.0)
MCH: 28.9 pg (ref 26.0–34.0)
MCHC: 32.2 g/dL (ref 30.0–36.0)
MCV: 89.7 fL (ref 80.0–100.0)
Monocytes Absolute: 0.9 10*3/uL (ref 0.1–1.0)
Monocytes Relative: 10 %
Neutro Abs: 5.4 10*3/uL (ref 1.7–7.7)
Neutrophils Relative %: 54 %
Platelets: 265 10*3/uL (ref 150–400)
RBC: 4.64 MIL/uL (ref 4.22–5.81)
RDW: 14.1 % (ref 11.5–15.5)
WBC: 9.8 10*3/uL (ref 4.0–10.5)
nRBC: 0 % (ref 0.0–0.2)

## 2022-09-26 LAB — BASIC METABOLIC PANEL
Anion gap: 8 (ref 5–15)
BUN: 9 mg/dL (ref 6–20)
CO2: 26 mmol/L (ref 22–32)
Calcium: 7.9 mg/dL — ABNORMAL LOW (ref 8.9–10.3)
Chloride: 102 mmol/L (ref 98–111)
Creatinine, Ser: 1.06 mg/dL (ref 0.61–1.24)
GFR, Estimated: >60 mL/min (ref >60)
Glucose, Bld: 109 mg/dL — ABNORMAL HIGH (ref 70–99)
Potassium: 4 mmol/L (ref 3.5–5.1)
Sodium: 136 mmol/L (ref 135–145)

## 2022-09-26 LAB — MAGNESIUM: Magnesium: 2.1 mg/dL (ref 1.7–2.4)

## 2022-09-26 MED ORDER — IBUPROFEN 600 MG PO TABS
600.0000 mg | ORAL_TABLET | Freq: Four times a day (QID) | ORAL | Status: DC
  Administered 2022-09-26 – 2022-09-30 (×18): 600 mg via ORAL
  Filled 2022-09-26 (×18): qty 1

## 2022-09-26 MED ORDER — SODIUM PHOSPHATES 45 MMOLE/15ML IV SOLN
30.0000 mmol | Freq: Once | INTRAVENOUS | Status: AC
  Administered 2022-09-26: 30 mmol via INTRAVENOUS
  Filled 2022-09-26: qty 10

## 2022-09-26 MED ORDER — HYDROMORPHONE HCL 1 MG/ML IJ SOLN
0.5000 mg | INTRAMUSCULAR | Status: DC | PRN
  Administered 2022-09-29 – 2022-09-30 (×3): 0.5 mg via INTRAVENOUS
  Filled 2022-09-26 (×3): qty 0.5

## 2022-09-26 MED ORDER — POTASSIUM & SODIUM PHOSPHATES 280-160-250 MG PO PACK: 1.0000 | PACK | Freq: Three times a day (TID) | ORAL | Status: DC

## 2022-09-26 NOTE — Progress Notes (Addendum)
Patient without complaints of nausea/vomiting this shift. He didnot have a bowel movement today and denies any flatus. He has ambulated in room without assistance and ambulated in hall with assistance a short distance maybe 150 feet.

## 2022-09-26 NOTE — Progress Notes (Signed)
[facility] Progress Note  2 Days Post-Op  Subjective: Feels slightly distended, he did not have a BM.   Objective: Vital signs in last 24 hours: Temp:  [98.5 F (36.9 C)-98.7 F (37.1 C)] 98.7 F (37.1 C) (09/16 0427) Pulse Rate:  [66-72] 66 (09/16 0427) Resp:  [16-17] 17 (09/16 0427) BP: (115-120)/(82-90) 115/82 (09/16 0427) SpO2:  [95 %] 95 % (09/16 0427) Weight:  [97.4 kg] 97.4 kg (09/16 0500) Last BM Date : 09/24/22  Intake/Output from previous day: 09/15 0701 - 09/16 0700 In: 971.7 [P.O.:720; I.V.:250; IV Piggyback:1.7] Out: -  Intake/Output this shift: No intake/output data recorded.  General appearance: alert and no distress GI: soft, mildly distended, appropriately tender, honeycomb with staining inferiorly   Lab Results:  Recent Labs    09/25/22 0527 09/26/22 0442  WBC 13.3* 9.8  HGB 13.1 13.4  HCT 40.6 41.6  PLT 268 265   BMET Recent Labs    09/25/22 0527 09/26/22 0442  NA 132* 136  K 3.7 4.0  CL 101 102  CO2 22 26  GLUCOSE 143* 109*  BUN 12 9  CREATININE 0.97 1.06  CALCIUM 7.4* 7.9*   PT/INR Recent Labs    09/24/22 1553  LABPROT 13.8  INR 1.0    Studies/Results: CT ABDOMEN PELVIS W CONTRAST  Result Date: 09/24/2022 CLINICAL DATA:  Popping sensation in the right hemiabdomen associated with increasing pain, swelling, and nausea EXAM: CT ABDOMEN AND PELVIS WITH CONTRAST TECHNIQUE: Multidetector CT imaging of the abdomen and pelvis was performed using the standard protocol following bolus administration of intravenous contrast. RADIATION DOSE REDUCTION: This exam was performed according to the departmental dose-optimization program which includes automated exposure control, adjustment of the mA and/or kV according to patient size and/or use of iterative reconstruction technique. CONTRAST:  OMNIPAQUE IOHEXOL 300 MG/ML  SOLN COMPARISON:  CTA abdomen and pelvis dated 01/26/2022 FINDINGS: Lower chest: No focal consolidation  or pulmonary nodule in the lung bases. No pleural effusion or pneumothorax demonstrated. Partially imaged heart size is normal. Hepatobiliary: No focal hepatic lesions. No intra or extrahepatic biliary ductal dilation. Normal gallbladder. Pancreas: No focal lesions or main ductal dilation. Spleen: Spleen measures 13.6 cm in the AP dimension, previously 11.1 cm. Adrenals/Urinary Tract: No adrenal nodules. No suspicious renal mass, calculi or hydronephrosis. Right upper pole simple cyst. No specific follow-up imaging recommended. no focal bladder wall thickening. Stomach/Bowel: Normal appearance of the stomach. Postsurgical changes of the sigmoid colon. Anastomosis is patent. Cecum is located within the left hemiabdomen with an area of marked luminal narrowing in the midline lower abdomen immediately inferior and deep to the umbilicus (5:55). A single loop of mildly dilated small bowel is present within the left upper quadrant adjacent to the gas-filled cecum. Additional loops of small bowel demonstrate fecalization of intraluminal contents. Appendectomy. Vascular/Lymphatic: No significant vascular findings are present. No enlarged abdominal or pelvic lymph nodes. Reproductive: Prostate is unremarkable. Other: No free fluid, fluid collection, or free air. Musculoskeletal: No acute or abnormal lytic or blastic osseous lesions. Multilevel degenerative changes of the partially imaged thoracic and lumbar spine. Postsurgical changes from prior right femoral fixation. Small fat-containing bilateral inguinal hernias. IMPRESSION: 1. Cecum is newly located within the left hemiabdomen, demonstrating an area of marked luminal narrowing in the midline lower abdomen immediately inferior and deep to the umbilicus, suspicious for high-grade cecal obstruction in this area, possibly related to adhesions, mesenteric defect, or cecal volvulus. 2. A single loop of mildly dilated small bowel is  present within the left upper quadrant  adjacent to the gas-filled cecum, which may reflect ileus. 3. Interval increase in splenomegaly. These results were called by telephone at the time of interpretation on 09/24/2022 at 3:02 pm to provider [facility] , who verbally acknowledged these results. Electronically Signed   By: Agustin Cree M.D.   On: 09/24/2022 15:02    Anti-infectives: Anti-infectives (From admission, onward)    Start     Dose/Rate Route Frequency Ordered Stop   09/25/22 0600  cefoTEtan (CEFOTAN) 2 g in sodium chloride 0.9 % 100 mL IVPB        2 g 200 mL/hr over 30 Minutes Intravenous On call to O.R. 09/24/22 1700 09/25/22 0537   09/25/22 0600  cefoTEtan (CEFOTAN) 2 g in sodium chloride 0.9 % 100 mL IVPB        2 g 200 mL/hr over 30 Minutes Intravenous Every 12 hours 09/24/22 2029 09/26/22 1039   09/24/22 1706  sodium chloride 0.9 % with cefoTEtan (CEFOTAN) ADS Med       Note to Pharmacy: Elvina Sidle S: cabinet override      09/24/22 1706 09/25/22 2208   09/24/22 1705  sodium chloride 0.9 % with cefOXitin (MEFOXIN) ADS Med  Status:  Discontinued       Note to Pharmacy: Elvina Sidle S: cabinet override      09/24/22 1705 09/24/22 1710       Assessment/Plan: Patient s/p R hemicolectomy for cecal volvulus.  Doing well.  Added Ibuprofen and tylenol for scheduled Prn narcotics, changed to dilaudid  IS, Oob HD ok Clear liquid Colace  SCDs. Lovenox Ambulate Labs in am, phos replaced IV  Cefotetan for 3 doses completed   LOS: 2 days    Lucretia Roers 09/26/2022

## 2022-09-26 NOTE — Plan of Care (Signed)
Problem: Activity: Goal: Risk for activity intolerance will decrease Outcome: Progressing   Problem: Coping: Goal: Level of anxiety will decrease Outcome: Progressing   Problem: Pain Managment: Goal: General experience of comfort will improve Outcome: Progressing

## 2022-09-27 LAB — CBC WITH DIFFERENTIAL/PLATELET
Abs Immature Granulocytes: 0.01 10*3/uL (ref 0.00–0.07)
Basophils Absolute: 0.1 10*3/uL (ref 0.0–0.1)
Basophils Relative: 1 %
Eosinophils Absolute: 0.4 10*3/uL (ref 0.0–0.5)
Eosinophils Relative: 5 %
HCT: 42.2 % (ref 39.0–52.0)
Hemoglobin: 13.8 g/dL (ref 13.0–17.0)
Immature Granulocytes: 0 %
Lymphocytes Relative: 35 %
Lymphs Abs: 3 10*3/uL (ref 0.7–4.0)
MCH: 29.1 pg (ref 26.0–34.0)
MCHC: 32.7 g/dL (ref 30.0–36.0)
MCV: 88.8 fL (ref 80.0–100.0)
Monocytes Absolute: 1 10*3/uL (ref 0.1–1.0)
Monocytes Relative: 12 %
Neutro Abs: 4.1 10*3/uL (ref 1.7–7.7)
Neutrophils Relative %: 47 %
Platelets: 301 10*3/uL (ref 150–400)
RBC: 4.75 MIL/uL (ref 4.22–5.81)
RDW: 14.4 % (ref 11.5–15.5)
WBC: 8.6 10*3/uL (ref 4.0–10.5)
nRBC: 0 % (ref 0.0–0.2)

## 2022-09-27 LAB — BASIC METABOLIC PANEL
Anion gap: 7 (ref 5–15)
BUN: 9 mg/dL (ref 6–20)
CO2: 26 mmol/L (ref 22–32)
Calcium: 8.4 mg/dL — ABNORMAL LOW (ref 8.9–10.3)
Chloride: 103 mmol/L (ref 98–111)
Creatinine, Ser: 1.05 mg/dL (ref 0.61–1.24)
GFR, Estimated: >60 mL/min (ref >60)
Glucose, Bld: 113 mg/dL — ABNORMAL HIGH (ref 70–99)
Potassium: 3.8 mmol/L (ref 3.5–5.1)
Sodium: 136 mmol/L (ref 135–145)

## 2022-09-27 LAB — PHOSPHORUS: Phosphorus: 2.2 mg/dL — ABNORMAL LOW (ref 2.5–4.6)

## 2022-09-27 LAB — MAGNESIUM: Magnesium: 2.2 mg/dL (ref 1.7–2.4)

## 2022-09-27 MED ORDER — POTASSIUM CHLORIDE CRYS ER 20 MEQ PO TBCR
20.0000 meq | EXTENDED_RELEASE_TABLET | Freq: Once | ORAL | Status: AC
  Administered 2022-09-27: 20 meq via ORAL
  Filled 2022-09-27: qty 1

## 2022-09-27 NOTE — Plan of Care (Signed)
Ambulated in hall several times today.

## 2022-09-27 NOTE — Plan of Care (Signed)
Problem: Education: Goal: Knowledge of General Education information will improve Description: Including pain rating scale, medication(s)/side effects and non-pharmacologic comfort measures Outcome: Progressing   Problem: Nutrition: Goal: Adequate nutrition will be maintained Outcome: Progressing   Problem: Elimination: Goal: Will not experience complications related to bowel motility Outcome: Progressing   Problem: Safety: Goal: Ability to remain free from injury will improve Outcome: Progressing

## 2022-09-27 NOTE — Progress Notes (Signed)
[facility]  No BM and only a little flatus. Having soreness. Ambulating. No nausea/ doing some clears.  BP 120/82 (BP Location: Left Arm)   Pulse 65   Temp 98.1 F (36.7 C) (Oral)   Resp 16   Ht 5\' 6"  (1.676 m)   Wt 92.2 kg   SpO2 95%   BMI 32.81 kg/m  Mildly distended, abd binder in place, honeycomb in place   Patient s/p R hemicolectomy for cecal volvulus. Doing fair. Needs to have BM.  Ibuprofen, tylenol PRN narcotics IS, OOB Clear diet Colace K replaced BMP tomorrow Lovenox Ambulate  Algis Greenhouse, MD [facility] [address] [place] , [place] [address] [phone] (office)

## 2022-09-27 NOTE — Progress Notes (Signed)
Up ambulating in hall multiple times this shift. He is now passing gas, no bowel movements yet. Abdomen is still tender and noted abdominal distention.

## 2022-09-28 ENCOUNTER — Inpatient Hospital Stay (HOSPITAL_COMMUNITY): Payer: Medicare Other

## 2022-09-28 MED ORDER — BISACODYL 10 MG RE SUPP: 10.0000 mg | Freq: Once | RECTAL | Status: DC

## 2022-09-28 MED ORDER — DEXTROSE-SODIUM CHLORIDE 5-0.45 % IV SOLN: INTRAVENOUS | Status: DC

## 2022-09-28 MED ORDER — ORAL CARE MOUTH RINSE: 15.0000 mL | OROMUCOSAL | Status: DC | PRN

## 2022-09-28 NOTE — Plan of Care (Signed)

## 2022-09-28 NOTE — Progress Notes (Signed)
[facility]  Patient has had BM now per the RN report. Was loose then formed and some blood. Was more distended today when I saw him and KUB with ileus type pattern (this was before BM), some gas in the colon.  BP (!) 133/90 (BP Location: Left Arm)   Pulse 60   Temp 98.6 F (37 C) (Oral)   Resp 18   Ht 5\' 6"  (1.676 m)   Wt 91.2 kg   SpO2 96%   BMI 32.45 kg/m  Soft, distended, appropriately tender, staples c/d/I with old blood at inferior part of incision  Patient s/p R hemicolectomy for cecal volvulus. Pathology benign.  Ibuprofen, tylenol PRN narcotics IS, OOB Clear diet, may need NG if he vomits but now with BM hopefully will continue to improve Colace D51/2NS +20K @ 50 given limited intake  Full labs tomorrow  Lovenox Ambulate  William Greenhouse, MD [facility] [address] [place], [place] [address] [phone] (office)

## 2022-09-28 NOTE — Progress Notes (Signed)
Patient has been stable and has rested well.  Patient pain has been controlled with scheduled medication, patient has not requested any prns this shift.   No bowel movement this shift.

## 2022-09-29 LAB — BASIC METABOLIC PANEL
Anion gap: 9 (ref 5–15)
BUN: 13 mg/dL (ref 6–20)
CO2: 25 mmol/L (ref 22–32)
Calcium: 8.6 mg/dL — ABNORMAL LOW (ref 8.9–10.3)
Chloride: 102 mmol/L (ref 98–111)
Creatinine, Ser: 1.14 mg/dL (ref 0.61–1.24)
GFR, Estimated: >60 mL/min (ref >60)
Glucose, Bld: 101 mg/dL — ABNORMAL HIGH (ref 70–99)
Potassium: 3.7 mmol/L (ref 3.5–5.1)
Sodium: 136 mmol/L (ref 135–145)

## 2022-09-29 LAB — CBC WITH DIFFERENTIAL/PLATELET
Abs Immature Granulocytes: 0.03 10*3/uL (ref 0.00–0.07)
Basophils Absolute: 0.1 10*3/uL (ref 0.0–0.1)
Basophils Relative: 1 %
Eosinophils Absolute: 0.5 10*3/uL (ref 0.0–0.5)
Eosinophils Relative: 6 %
HCT: 47 % (ref 39.0–52.0)
Hemoglobin: 15.4 g/dL (ref 13.0–17.0)
Immature Granulocytes: 0 %
Lymphocytes Relative: 36 %
Lymphs Abs: 3.3 10*3/uL (ref 0.7–4.0)
MCH: 29.1 pg (ref 26.0–34.0)
MCHC: 32.8 g/dL (ref 30.0–36.0)
MCV: 88.7 fL (ref 80.0–100.0)
Monocytes Absolute: 1 10*3/uL (ref 0.1–1.0)
Monocytes Relative: 11 %
Neutro Abs: 4.4 10*3/uL (ref 1.7–7.7)
Neutrophils Relative %: 46 %
Platelets: 355 10*3/uL (ref 150–400)
RBC: 5.3 MIL/uL (ref 4.22–5.81)
RDW: 13.9 % (ref 11.5–15.5)
WBC: 9.4 10*3/uL (ref 4.0–10.5)
nRBC: 0 % (ref 0.0–0.2)

## 2022-09-29 LAB — PHOSPHORUS: Phosphorus: 3.1 mg/dL (ref 2.5–4.6)

## 2022-09-29 LAB — MAGNESIUM: Magnesium: 1.9 mg/dL (ref 1.7–2.4)

## 2022-09-29 MED ORDER — SODIUM CHLORIDE 0.9 % IV SOLN
12.5000 mg | Freq: Four times a day (QID) | INTRAVENOUS | Status: DC | PRN
  Administered 2022-09-30: 12.5 mg via INTRAVENOUS
  Filled 2022-09-29 (×2): qty 0.5

## 2022-09-29 MED ORDER — SIMETHICONE 40 MG/0.6ML PO SUSP: 80.0000 mg | Freq: Four times a day (QID) | ORAL | Status: DC | PRN

## 2022-09-29 NOTE — Progress Notes (Signed)
[facility]  Improving slowly. Having Bms. Does not like the full liquid diet.  BP 138/89 (BP Location: Left Arm)   Pulse 77   Temp 98.5 F (36.9 C) (Oral)   Resp 18   Ht 5\' 6"  (1.676 m)   Wt 94.4 kg   SpO2 99%   BMI 33.59 kg/m  Soft, distended, staples c/d/I with no erythema,or drainage, minor bruising  Patient s/p R hemicolectomy for cecal volvulus. Pathology benign.   Ibuprofen, tylenol PRN narcotics IS, OOB Soft diet Colace D51/2NS +20K @ 50 given limited intake  BMP tomorrow Lovenox Ambulate  Algis Greenhouse, MD [facility] [address] [place], [place] [address] [phone] (office)

## 2022-09-29 NOTE — Care Management Important Message (Signed)
Important Message  Patient Details  Name: William Mcdonald MRN: [id number] Date of Birth: 1975-02-08   Medicare Important Message Given:  Yes     Corey Harold 09/29/2022, 4:28 PM

## 2022-09-29 NOTE — Plan of Care (Signed)

## 2022-09-30 LAB — CBC WITH DIFFERENTIAL/PLATELET
Abs Immature Granulocytes: 0.03 10*3/uL (ref 0.00–0.07)
Basophils Absolute: 0.1 10*3/uL (ref 0.0–0.1)
Basophils Relative: 1 %
Eosinophils Absolute: 0.5 10*3/uL (ref 0.0–0.5)
Eosinophils Relative: 4 %
HCT: 44.9 % (ref 39.0–52.0)
Hemoglobin: 14.6 g/dL (ref 13.0–17.0)
Immature Granulocytes: 0 %
Lymphocytes Relative: 39 %
Lymphs Abs: 4.8 10*3/uL — ABNORMAL HIGH (ref 0.7–4.0)
MCH: 28.7 pg (ref 26.0–34.0)
MCHC: 32.5 g/dL (ref 30.0–36.0)
MCV: 88.2 fL (ref 80.0–100.0)
Monocytes Absolute: 1.3 10*3/uL — ABNORMAL HIGH (ref 0.1–1.0)
Monocytes Relative: 11 %
Neutro Abs: 5.6 10*3/uL (ref 1.7–7.7)
Neutrophils Relative %: 45 %
Platelets: 359 10*3/uL (ref 150–400)
RBC: 5.09 MIL/uL (ref 4.22–5.81)
RDW: 13.7 % (ref 11.5–15.5)
WBC: 12.4 10*3/uL — ABNORMAL HIGH (ref 4.0–10.5)
nRBC: 0 % (ref 0.0–0.2)

## 2022-09-30 LAB — BASIC METABOLIC PANEL
Anion gap: 11 (ref 5–15)
BUN: 16 mg/dL (ref 6–20)
CO2: 26 mmol/L (ref 22–32)
Calcium: 8.4 mg/dL — ABNORMAL LOW (ref 8.9–10.3)
Chloride: 99 mmol/L (ref 98–111)
Creatinine, Ser: 0.99 mg/dL (ref 0.61–1.24)
GFR, Estimated: >60 mL/min (ref >60)
Glucose, Bld: 113 mg/dL — ABNORMAL HIGH (ref 70–99)
Potassium: 3.6 mmol/L (ref 3.5–5.1)
Sodium: 136 mmol/L (ref 135–145)

## 2022-09-30 MED ORDER — PROMETHAZINE HCL 25 MG/ML IJ SOLN
INTRAMUSCULAR | Status: AC
  Filled 2022-09-30: qty 1

## 2022-09-30 MED ORDER — PROMETHAZINE HCL 12.5 MG PO TABS: 12.5000 mg | ORAL_TABLET | Freq: Four times a day (QID) | ORAL | 0 refills | Status: DC | PRN

## 2022-09-30 MED ORDER — PANTOPRAZOLE SODIUM 40 MG PO TBEC: 40.0000 mg | DELAYED_RELEASE_TABLET | Freq: Every day | ORAL | Status: DC

## 2022-09-30 MED ORDER — OXYCODONE HCL 5 MG PO TABS: 5.0000 mg | ORAL_TABLET | ORAL | 0 refills | Status: DC | PRN

## 2022-09-30 MED ORDER — ATORVASTATIN CALCIUM 20 MG PO TABS: 40.0000 mg | ORAL_TABLET | Freq: Every day | ORAL | Status: DC

## 2022-09-30 MED ORDER — DOCUSATE SODIUM 100 MG PO CAPS: 100.0000 mg | ORAL_CAPSULE | Freq: Two times a day (BID) | ORAL | 0 refills | Status: DC | PRN

## 2022-09-30 MED ORDER — ONDANSETRON HCL 4 MG PO TABS: 4.0000 mg | ORAL_TABLET | Freq: Four times a day (QID) | ORAL | 0 refills | Status: DC | PRN

## 2022-09-30 NOTE — Plan of Care (Signed)

## 2022-09-30 NOTE — Discharge Summary (Signed)
Physician Discharge Summary  Patient ID: William Mcdonald MRN: [id number] DOB/AGE: 49-Oct-1977 49 y.o.  Admit date: 09/24/2022 Discharge date: 09/30/2022   Admission Diagnoses: Cecal volvulus   Discharge Diagnoses:  Principal Problem:   Cecal volvulus [facility])   Discharged Condition: fair  Hospital Course: Mr. Chevez is a 47 yo who came in to the ED with abdominal pain and concern for cecal volvulus versus internal hernia. He went for an exploration and had a volvulus. A right hemicolectomy was done and he had an ileus post operatively. He has been tolerating liquids and some foods but still feels full. He has had multiple Bms and had 3 yesterday that were not documented. He initially had some bloody Bms but this resolved. He has been up and ambulating. WBC today was up a little but his neutrophils are low. I think this is more inflammatory reaction. He has not had any fevers. Discussed with him options of staying another night versus home since he is tolerating intake and having Bms. He wants to go home and monitor things. He knows to call or return to ED with concern for worsening pain, distention, fevers.   Consults: None  Significant Diagnostic Studies:    Latest Reference Range & Units 09/29/22 04:55 09/30/22 04:48  Sodium 135 - 145 mmol/L 136 136  Potassium 3.5 - 5.1 mmol/L 3.7 3.6  Chloride 98 - 111 mmol/L 102 99  CO2 22 - 32 mmol/L 25 26  Glucose 70 - 99 mg/dL 409 (H) 811 (H)  BUN 6 - 20 mg/dL 13 16  Creatinine 9.14 - 1.24 mg/dL 7.82 9.56  Calcium 8.9 - 10.3 mg/dL 8.6 (L) 8.4 (L)  Anion gap 5 - 15  9 11   Phosphorus 2.5 - 4.6 mg/dL 3.1   Magnesium 1.7 - 2.4 mg/dL 1.9   (H): Data is abnormally high (L): Data is abnormally low   Latest Reference Range & Units 09/29/22 04:55 09/30/22 04:48  WBC 4.0 - 10.5 K/uL 9.4 12.4 (H)  RBC 4.22 - 5.81 MIL/uL 5.30 5.09  Hemoglobin 13.0 - 17.0 g/dL 21.3 08.6  HCT 57.8 - 46.9 % 47.0 44.9  MCV 80.0 - 100.0 fL 88.7 88.2  MCH 26.0 - 34.0 pg 29.1  28.7  MCHC 30.0 - 36.0 g/dL 62.9 52.8  RDW 41.3 - 24.4 % 13.9 13.7  Platelets 150 - 400 K/uL 355 359  nRBC 0.0 - 0.2 % 0.0 0.0  Neutrophils % 46 45  Lymphocytes % 36 39  Monocytes Relative % 11 11  Eosinophil % 6 4  Basophil % 1 1  Immature Granulocytes % 0 0  NEUT# 1.7 - 7.7 K/uL 4.4 5.6  Lymphocyte # 0.7 - 4.0 K/uL 3.3 4.8 (H)  Monocyte # 0.1 - 1.0 K/uL 1.0 1.3 (H)  Eosinophils Absolute 0.0 - 0.5 K/uL 0.5 0.5  Basophils Absolute 0.0 - 0.1 K/uL 0.1 0.1  Abs Immature Granulocytes 0.00 - 0.07 K/uL 0.03 0.03  (H): Data is abnormally high Treatments: IV hydration, antibiotics: Cefotetan 3 doses post op given the non prepped bowel, and Right hemicolectomy 09/24/22  Discharge Exam: Blood pressure 125/78, pulse (!) 52, temperature (!) 97.4 F (36.3 C), temperature source Oral, resp. rate 18, height 5\' 6"  (1.676 m), weight 91.3 kg, SpO2 95%. General appearance: alert and no distress GI: soft, mildly distended, mildly tender, staples c/d/I with no erythema or drainage, some bruising evolving   Disposition: Discharge disposition: 01-Home or Self Care       Discharge Instructions  Call MD for:  difficulty breathing, headache or visual disturbances   Complete by: As directed    Call MD for:  extreme fatigue   Complete by: As directed    Call MD for:  persistant dizziness or light-headedness   Complete by: As directed    Call MD for:  persistant nausea and vomiting   Complete by: As directed    Call MD for:  redness, tenderness, or signs of infection (pain, swelling, redness, odor or green/yellow discharge around incision site)   Complete by: As directed    Call MD for:  severe uncontrolled pain   Complete by: As directed    Call MD for:  temperature >100.4   Complete by: As directed    Increase activity slowly   Complete by: As directed       Allergies as of 09/30/2022   No Known Allergies      Medication List     TAKE these medications    atorvastatin 20 MG  tablet Commonly known as: LIPITOR Take 2 tablets (40 mg total) by mouth daily.   docusate sodium 100 MG capsule Commonly known as: COLACE Take 1 capsule (100 mg total) by mouth 2 (two) times daily as needed for mild constipation.   ibuprofen 200 MG tablet Commonly known as: ADVIL Take 800 mg by mouth 2 (two) times daily as needed for headache or moderate pain.   METOPROLOL TARTRATE PO Take 1 tablet by mouth daily as needed (elevated blood pressure, migraine).   ondansetron 4 MG tablet Commonly known as: ZOFRAN Take 1 tablet (4 mg total) by mouth every 6 (six) hours as needed for nausea.   oxyCODONE 5 MG immediate release tablet Commonly known as: Oxy IR/ROXICODONE Take 1 tablet (5 mg total) by mouth every 4 (four) hours as needed for severe pain or breakthrough pain.   pantoprazole 40 MG tablet Commonly known as: PROTONIX Take 1 tablet (40 mg total) by mouth daily.        Follow-up Information     Lucretia Roers, MD Follow up on 10/06/2022.   Specialty: General Surgery Contact information: [address] [place] [facility] [address] [phone]                Future Appointments  Date Time Provider Department Center  10/06/2022  2:45 PM Lucretia Roers, MD [facility] None    Signed: Lucretia Roers 09/30/2022, 11:19 AM

## 2022-09-30 NOTE — Discharge Instructions (Addendum)
Discharge Open Abdominal Surgery Instructions:  Monitor for any redness at the incision, worsening pain, fevers.   Common Complaints: Pain at the incision site is common. This will improve with time. Take your pain medications as described below. Some nausea is common and poor appetite. The main goal is to stay hydrated the first few days after surgery.   Diet/ Activity: Diet as tolerated. You have started and tolerated a diet in the hospital, and should continue to increase what you are able to eat.   You may not have a large appetite, but it is important to stay hydrated. Drink 64 ounces of water a day. Your appetite will return with time.  Keep a dry dressing in place over your staples daily or as needed. Some minor pink/ blood tinged drainage is expected. This will stop in a few days after surgery.  Shower per your regular routine daily.  Do not take hot showers. Take warm showers that are less than 10 minutes. Path the incision dry. Wear an abdominal binder daily with activity. You do not have to wear this while sleeping or sitting.  Rest and listen to your body, but do not remain in bed all day.  Walk everyday for at least 15-20 minutes. Deep cough and move around every 1-2 hours in the first few days after surgery.  Do not lift > 10 lbs, perform excessive bending, pushing, pulling, squatting for 6-8 weeks after surgery.  The activity restrictions and the abdominal binder are to prevent hernia formation at your incision while you are healing.  Do not place lotions or balms on your incision unless instructed to specifically by Dr. Henreitta Leber.   Pain Expectations and Narcotics: -After surgery you will have pain associated with your incisions and this is normal. The pain is muscular and nerve pain, and will get better with time. -You are encouraged and expected to take non narcotic medications like tylenol and ibuprofen (when able) to treat pain as multiple modalities can aid with pain  treatment. -Narcotics are only used when pain is severe or there is breakthrough pain. -You are not expected to have a pain score of 0 after surgery, as we cannot prevent pain. A pain score of 3-4 that allows you to be functional, move, walk, and tolerate some activity is the goal. The pain will continue to improve over the days after surgery and is dependent on your surgery. -Due to [place] law, we are only able to give a certain amount of pain medication to treat post operative pain, and we only give additional narcotics on a patient by patient basis.  -For most laparoscopic surgery, studies have shown that the majority of patients only need 10-15 narcotic pills, and for open surgeries most patients only need 15-20.   -Having appropriate expectations of pain and knowledge of pain management with non narcotics is important as we do not want anyone to become addicted to narcotic pain medication.  -Using ice packs in the first 48 hours and heating pads after 48 hours, wearing an abdominal binder (when recommended), and using over the counter medications are all ways to help with pain management.   -Simple acts like meditation and mindfulness practices after surgery can also help with pain control and research has proven the benefit of these practices.  Medication: Take tylenol and ibuprofen as needed for pain control, alternating every 4-6 hours.  Example:  Tylenol 1000mg  @ 6am, 12noon, 6pm, (Do not exceed 4000mg  of tylenol a day). Ibuprofen 800mg  @  9am, 3pm, 9pm, 3am (Do not exceed 3600mg  of ibuprofen a day).  Take Roxicodone for breakthrough pain every 4 hours.  Take Colace for constipation related to narcotic pain medication. If you do not have a bowel movement in 2 days, take Miralax over the counter.  Drink plenty of water to also prevent constipation.   Contact Information: If you have questions or concerns, please call our office, [phone], Monday- Thursday 8AM-5PM and Friday  8AM-12Noon.  If it is after hours or on the weekend, please call [facility], [phone], [phone], and ask to speak to the surgeon on call for Dr. Henreitta Leber at [facility].

## 2022-09-30 NOTE — Progress Notes (Signed)
[facility]  Phenergan oral sent in at  patient request.  Algis Greenhouse, MD

## 2022-10-06 ENCOUNTER — Encounter: Payer: Self-pay | Admitting: General Surgery

## 2022-10-06 ENCOUNTER — Emergency Department (HOSPITAL_COMMUNITY)
Admission: EM | Admit: 2022-10-06 | Discharge: 2022-10-06 | Disposition: A | Discharge status: Home / Self Care | Payer: Medicare Other

## 2022-10-06 ENCOUNTER — Emergency Department (HOSPITAL_COMMUNITY): Payer: Medicare Other

## 2022-10-06 ENCOUNTER — Encounter (HOSPITAL_COMMUNITY): Payer: Self-pay | Admitting: *Deleted

## 2022-10-06 ENCOUNTER — Ambulatory Visit (INDEPENDENT_AMBULATORY_CARE_PROVIDER_SITE_OTHER): Payer: Medicare Other | Admitting: General Surgery

## 2022-10-06 ENCOUNTER — Other Ambulatory Visit: Payer: Self-pay

## 2022-10-06 VITALS — BP 134/92 | HR 80 | Temp 98.1°F | Resp 12 | Ht 66.0 in | Wt 193.0 lb

## 2022-10-06 DIAGNOSIS — R1084 Generalized abdominal pain: Secondary | ICD-10-CM

## 2022-10-06 DIAGNOSIS — R109 Unspecified abdominal pain: Secondary | ICD-10-CM | POA: Diagnosis present

## 2022-10-06 DIAGNOSIS — K562 Volvulus: Secondary | ICD-10-CM

## 2022-10-06 LAB — CULTURE, BLOOD (ROUTINE X 2)
Special Requests: ADEQUATE
Special Requests: ADEQUATE

## 2022-10-06 LAB — COMPREHENSIVE METABOLIC PANEL
ALT: 61 U/L — ABNORMAL HIGH (ref 0–44)
AST: 26 U/L (ref 15–41)
Albumin: 3.6 g/dL (ref 3.5–5.0)
Alkaline Phosphatase: 97 U/L (ref 38–126)
Anion gap: 11 (ref 5–15)
BUN: 27 mg/dL — ABNORMAL HIGH (ref 6–20)
CO2: 21 mmol/L — ABNORMAL LOW (ref 22–32)
Calcium: 8.9 mg/dL (ref 8.9–10.3)
Chloride: 101 mmol/L (ref 98–111)
Creatinine, Ser: 1.16 mg/dL (ref 0.61–1.24)
GFR, Estimated: >60 mL/min (ref >60)
Glucose, Bld: 117 mg/dL — ABNORMAL HIGH (ref 70–99)
Potassium: 4.5 mmol/L (ref 3.5–5.1)
Sodium: 133 mmol/L — ABNORMAL LOW (ref 135–145)
Total Bilirubin: 0.8 mg/dL (ref 0.3–1.2)
Total Protein: 7.7 g/dL (ref 6.5–8.1)

## 2022-10-06 LAB — URINALYSIS, ROUTINE W REFLEX MICROSCOPIC
Bilirubin Urine: NEGATIVE
Glucose, UA: NEGATIVE mg/dL
Hgb urine dipstick: NEGATIVE
Ketones, ur: NEGATIVE mg/dL
Leukocytes,Ua: NEGATIVE
Nitrite: NEGATIVE
Protein, ur: 30 mg/dL — AB
Specific Gravity, Urine: >1.046 — ABNORMAL HIGH (ref 1.005–1.030)
pH: 5 (ref 5.0–8.0)

## 2022-10-06 LAB — CBC WITH DIFFERENTIAL/PLATELET
Abs Immature Granulocytes: 0.04 10*3/uL (ref 0.00–0.07)
Basophils Absolute: 0.1 10*3/uL (ref 0.0–0.1)
Basophils Relative: 1 %
Eosinophils Absolute: 0.1 10*3/uL (ref 0.0–0.5)
Eosinophils Relative: 1 %
HCT: 49.2 % (ref 39.0–52.0)
Hemoglobin: 16.7 g/dL (ref 13.0–17.0)
Immature Granulocytes: 0 %
Lymphocytes Relative: 37 %
Lymphs Abs: 4.7 10*3/uL — ABNORMAL HIGH (ref 0.7–4.0)
MCH: 29.1 pg (ref 26.0–34.0)
MCHC: 33.9 g/dL (ref 30.0–36.0)
MCV: 85.9 fL (ref 80.0–100.0)
Monocytes Absolute: 1.6 10*3/uL — ABNORMAL HIGH (ref 0.1–1.0)
Monocytes Relative: 13 %
Neutro Abs: 6.1 10*3/uL (ref 1.7–7.7)
Neutrophils Relative %: 48 %
Platelets: 463 10*3/uL — ABNORMAL HIGH (ref 150–400)
RBC: 5.73 MIL/uL (ref 4.22–5.81)
RDW: 13.8 % (ref 11.5–15.5)
WBC: 12.6 10*3/uL — ABNORMAL HIGH (ref 4.0–10.5)
nRBC: 0 % (ref 0.0–0.2)

## 2022-10-06 LAB — LIPASE, BLOOD: Lipase: 31 U/L (ref 11–51)

## 2022-10-06 MED ORDER — IOHEXOL 300 MG/ML  SOLN
100.0000 mL | Freq: Once | INTRAMUSCULAR | Status: AC | PRN
  Administered 2022-10-06: 100 mL via INTRAVENOUS

## 2022-10-06 MED ORDER — METOCLOPRAMIDE HCL 10 MG PO TABS: 10.0000 mg | ORAL_TABLET | Freq: Four times a day (QID) | ORAL | 0 refills | Status: DC

## 2022-10-06 MED ORDER — OXYCODONE HCL 5 MG PO TABS: 5.0000 mg | ORAL_TABLET | ORAL | 0 refills | Status: DC | PRN

## 2022-10-06 MED ORDER — ONDANSETRON HCL 4 MG/2ML IJ SOLN
4.0000 mg | Freq: Once | INTRAMUSCULAR | Status: AC
  Administered 2022-10-06: 4 mg via INTRAVENOUS
  Filled 2022-10-06: qty 2

## 2022-10-06 MED ORDER — MORPHINE SULFATE (PF) 4 MG/ML IV SOLN
4.0000 mg | Freq: Once | INTRAVENOUS | Status: AC
  Administered 2022-10-06: 4 mg via INTRAVENOUS
  Filled 2022-10-06: qty 1

## 2022-10-06 MED ORDER — IOHEXOL 9 MG/ML PO SOLN
ORAL | Status: AC
  Filled 2022-10-06: qty 500

## 2022-10-06 MED ORDER — PROMETHAZINE HCL 12.5 MG PO TABS: 12.5000 mg | ORAL_TABLET | Freq: Four times a day (QID) | ORAL | 0 refills | Status: DC | PRN

## 2022-10-06 MED ORDER — METOCLOPRAMIDE HCL 10 MG PO TABS
10.0000 mg | ORAL_TABLET | Freq: Once | ORAL | Status: AC
  Administered 2022-10-06: 10 mg via ORAL
  Filled 2022-10-06: qty 1

## 2022-10-06 MED ORDER — KETOROLAC TROMETHAMINE 15 MG/ML IJ SOLN
15.0000 mg | Freq: Once | INTRAMUSCULAR | Status: AC
  Administered 2022-10-06: 15 mg via INTRAVENOUS
  Filled 2022-10-06: qty 1

## 2022-10-06 NOTE — Progress Notes (Signed)
[facility]  Lab work reassuring and CT scan with dilated small bowel and patent anastomosis. Some debris in the stomach. Likely still has a resolving ileus. No fluid collections or any type of leak or abscess.  Clinically, he has been having liquid stools.  We get ED try some Reglan but this could make stools looser. You ca  We'll see him in the clinic next week to check on him.  Algis Greenhouse, MD

## 2022-10-06 NOTE — ED Notes (Signed)
Received report from Virgel Manifold RN. Pt just came back from ct wanting water. Advised need to wait for ct results. Pt grimacing. Edp aware of pain and needing pain med.

## 2022-10-06 NOTE — Patient Instructions (Signed)
Will get CT and labs to assess what could be causing your symptoms. Continue to drink what you are able to drink. Monitor for any fevers or redness/ drainage from your wound.  Will call with results.

## 2022-10-06 NOTE — Discharge Instructions (Signed)
Your workup today was reassuring.  Please take the oxycodone as prescribed by Dr. Henreitta Leber.  She also recommended taking the Reglan.  Take this for nausea and follow-up with her on Wednesday as scheduled.  Return to the ER for worsening symptoms.

## 2022-10-06 NOTE — ED Triage Notes (Signed)
Pt had abdominal surgery last Friday and states since the surgery has has been having progressively worse pain  Pt c/o nausea  Last BM was today  He was just at the surgeon's office (Dr. Henreitta Leber) and she told pt to come to ED to have a CT

## 2022-10-06 NOTE — ED Provider Notes (Signed)
[place] EMERGENCY DEPARTMENT AT [facility] Provider Note   CSN: [id number] Arrival date & time: 10/06/22  5784     History  Chief Complaint  Patient presents with   Abdominal Pain    William Mcdonald is a 47 y.o. male.  47 year old male with recent cecal volvulus with exploratory laparotomy and right hemicolectomy presenting to the emergency department today after being sent in by his surgeon for abdominal pain and nausea.  The patient states that since he got at the hospital he had gradually worsening abdominal discomfort and nausea.  He states he is having pain mostly in the right lower quadrant but he reports that he is having some mild diffuse pain.  He reports that he has been having some watery diarrhea since he got out of the hospital.  He has had nausea but denies any vomiting.  He has had some chills but denies any fevers.  He went to follow-up with the surgeon today was subsequently sent in for further evaluation for possible abscess.  I discussed this case with his surgeon before he arrived.   Abdominal Pain Associated symptoms: chills and nausea        Home Medications Prior to Admission medications   Medication Sig Start Date End Date Taking? Authorizing Provider  docusate sodium (COLACE) 100 MG capsule Take 1 capsule (100 mg total) by mouth 2 (two) times daily as needed for mild constipation. 09/30/22  Yes Lucretia Roers, MD  ibuprofen (ADVIL) 200 MG tablet Take 800 mg by mouth 2 (two) times daily as needed for headache or moderate pain.   Yes [provider]  metoCLOPramide (REGLAN) 10 MG tablet Take 1 tablet (10 mg total) by mouth every 6 (six) hours. 10/06/22  Yes Durwin Glaze, MD  ondansetron (ZOFRAN) 4 MG tablet Take 1 tablet (4 mg total) by mouth every 6 (six) hours as needed for nausea. 09/30/22  Yes Lucretia Roers, MD  oxyCODONE (OXY IR/ROXICODONE) 5 MG immediate release tablet Take 1 tablet (5 mg total) by mouth every 4 (four) hours as  needed for severe pain or breakthrough pain. 10/06/22  Yes Lucretia Roers, MD  pantoprazole (PROTONIX) 40 MG tablet Take 1 tablet (40 mg total) by mouth daily. 09/30/22  Yes Lucretia Roers, MD  promethazine (PHENERGAN) 12.5 MG tablet Take 1 tablet (12.5 mg total) by mouth every 6 (six) hours as needed for refractory nausea / vomiting. 10/06/22  Yes Lucretia Roers, MD      Allergies    Patient has no known allergies.    Review of Systems   Review of Systems  Constitutional:  Positive for chills.  Gastrointestinal:  Positive for abdominal pain and nausea.  All other systems reviewed and are negative.   Physical Exam Updated Vital Signs BP 124/82   Pulse 75   Temp 97.7 F (36.5 C) (Oral)   Resp 19   Ht 5\' 6"  (1.676 m)   Wt 87.5 kg   SpO2 94%   BMI 31.14 kg/m  Physical Exam Vitals and nursing note reviewed.   Gen: NAD Eyes: PERRL, EOMI HEENT: no oropharyngeal swelling Neck: trachea midline Resp: clear to auscultation bilaterally Card: RRR, no murmurs, rubs, or gallops Abd: Midline abdominal incision with no dehiscence or no surrounding erythema noted, the patient patient is diffusely tender with maximal tenderness over the right lower quadrant without guarding or rebound Extremities: no calf tenderness, no edema Vascular: 2+ radial pulses bilaterally, 2+ DP pulses bilaterally Skin:  no rashes Psyc: acting appropriately   ED Results / Procedures / Treatments   Labs (all labs ordered are listed, but only abnormal results are displayed) Labs Reviewed  CBC WITH DIFFERENTIAL/PLATELET - Abnormal; Notable for the following components:      Result Value   WBC 12.6 (*)    Platelets 463 (*)    Lymphs Abs 4.7 (*)    Monocytes Absolute 1.6 (*)    All other components within normal limits  COMPREHENSIVE METABOLIC PANEL - Abnormal; Notable for the following components:   Sodium 133 (*)    CO2 21 (*)    Glucose, Bld 117 (*)    BUN 27 (*)    ALT 61 (*)    All other  components within normal limits  URINALYSIS, ROUTINE W REFLEX MICROSCOPIC - Abnormal; Notable for the following components:   Specific Gravity, Urine >1.046 (*)    Protein, ur 30 (*)    Bacteria, UA RARE (*)    All other components within normal limits  CULTURE, BLOOD (ROUTINE X 2)  CULTURE, BLOOD (ROUTINE X 2)  LIPASE, BLOOD    EKG None  Radiology CT ABDOMEN PELVIS W CONTRAST  Result Date: 10/06/2022 CLINICAL DATA:  Abdominal surgery last Friday with progressively worsening pain and nausea. EXAM: CT ABDOMEN AND PELVIS WITH CONTRAST TECHNIQUE: Multidetector CT imaging of the abdomen and pelvis was performed using the standard protocol following bolus administration of intravenous contrast. RADIATION DOSE REDUCTION: This exam was performed according to the departmental dose-optimization program which includes automated exposure control, adjustment of the mA and/or kV according to patient size and/or use of iterative reconstruction technique. CONTRAST:  OMNIPAQUE IOHEXOL 300 MG/ML  SOLN COMPARISON:  09/24/2022. FINDINGS: Lower chest: Dependent atelectasis is noted at the lung bases. Hepatobiliary: No focal liver abnormality is seen. No gallstones, gallbladder wall thickening, or biliary dilatation. Pancreas: Unremarkable. No pancreatic ductal dilatation or surrounding inflammatory changes. Spleen: Normal in size without focal abnormality. Adrenals/Urinary Tract: The adrenal glands are within normal limits. There is a cyst in the upper pole of the right kidney. The kidneys enhance symmetrically. No renal calculus or hydronephrosis. No ureteral calculus or obstructive uropathy. The bladder is unremarkable. Stomach/Bowel: The stomach is within normal limits. Multiple dilated loops of small bowel are noted in the abdomen measuring up to 4.1 cm. Partial colectomy on the right surgical changes are noted with anastomosis in the mid abdomen. There is dilatation of the ileum up to the level of the  anastomosis without definite evidence of obstruction. Surgical anastomosis is also present at the rectosigmoid colon. No free air or pneumatosis. Vascular/Lymphatic: No significant vascular findings are present. No enlarged abdominal or pelvic lymph nodes. Reproductive: Prostate is unremarkable. Other: A trace amount of free fluid is present in the pelvis. Small fat containing inguinal hernias are present bilaterally. A surgical incision is noted in the anterior abdominal wall in the midline. Musculoskeletal: No acute osseous abnormality. IMPRESSION: Partial colectomy changes on the right with dilatation of the small bowel to the level of the anastomosis. No obvious obstruction is seen. Findings may be related to ileus versus partial or early small bowel obstruction. Surgical consultation is recommended. Electronically Signed   By: Thornell Sartorius M.D.   On: 10/06/2022 21:21    Procedures Procedures    Medications Ordered in ED Medications  metoCLOPramide (REGLAN) tablet 10 mg (has no administration in time range)  morphine (PF) 4 MG/ML injection 4 mg (4 mg Intravenous Given 10/06/22 1752)  ondansetron (ZOFRAN)  injection 4 mg (4 mg Intravenous Given 10/06/22 1752)  iohexol (OMNIPAQUE) 300 MG/ML solution 100 mL (100 mLs Intravenous Contrast Given 10/06/22 1847)  morphine (PF) 4 MG/ML injection 4 mg (4 mg Intravenous Given 10/06/22 1922)    ED Course/ Medical Decision Making/ A&P                                 Medical Decision Making 47 year old male with past medical history of cecal volvulus status post right hemicolectomy and exploratory laparotomy presents emergency department today with abdominal pain.  I will further evaluate patient here with basic labs as well as blood cultures.  I will obtain a CT scan of his abdomen to evaluate for intra-abdominal abscess or other postsurgical complication.  I will give the patient morphine and Zofran for his symptoms and reevaluate for ultimate  disposition.  The patient's labs are reassuring.  CT scan showed findings consistent with ileus versus small bowel obstruction.  Dr. Henreitta Leber reviewed this and recommend starting the patient on Reglan and having him follow-up in the clinic.  The patient does report that he is having loose stools so this does not seem consistent with bowel obstruction.  I think that he is stable for discharge.  Risk Prescription drug management.           Final Clinical Impression(s) / ED Diagnoses Final diagnoses:  Generalized abdominal pain    Rx / DC Orders ED Discharge Orders          Ordered    metoCLOPramide (REGLAN) 10 MG tablet  Every 6 hours        10/06/22 2156              Durwin Glaze, MD 10/06/22 2158

## 2022-10-06 NOTE — Progress Notes (Signed)
[facility] Note   HPI:  47 y.o. Male presents to clinic for post-op follow-up evaluation after ex lap right hemicolectomy for cecal volvulus. He had an ileus post op and we went slow on progressing his diet. He says he has been having daily liquid stools but is very nauseated and had a lot of upper abdominal pain. He is eating minimally. No fevers. He says he did have some random drainage on his shirt the other day from his incision.   Review of Systems:  Nausea Liquid Bms Bloating  All other review of systems: otherwise negative   Vital Signs:  BP (!) 134/92   Pulse 80   Temp 98.1 F (36.7 C) (Oral)   Resp 12   Ht 5\' 6"  (1.676 m)   Wt 193 lb (87.5 kg)   SpO2 96%   BMI 31.15 kg/m    Physical Exam:  Physical Exam HENT:     Head: Normocephalic.  Cardiovascular:     Rate and Rhythm: Normal rate.  Pulmonary:     Effort: Pulmonary effort is normal.  Abdominal:     General: There is distension.     Palpations: Abdomen is soft.     Tenderness: There is abdominal tenderness.     Comments: Incision c/d/I with no erythema or drainage, staples removed, steri strips placed, nothing opened up or expressed   Neurological:     Mental Status: He is alert.     Assessment:  47 y.o. yo Male with nausea and upper abdominal pain with some liquid stools. He did have an ileus and what slowly improving last week but is not much better and feels worse in the last few days. He has no fevers but is at risk for abscess, ileus or other post op issue.   Plan:  - Labs -CT scan -Tried to get this all stat as an outpatient but this could not be arranged for today -Called ED and sent patient over as he is feeling poorly and need to get this information in a timely manner, discussed with Dr. Rhae Hammock and updated my Partner Dr. Theophilus Kinds, DO who is on call.     Algis Greenhouse, MD [facility] [address] [place], [place]  [id number] [phone] (office)

## 2022-10-07 ENCOUNTER — Ambulatory Visit (HOSPITAL_COMMUNITY): Payer: Medicare Other | Attending: General Surgery

## 2022-10-11 LAB — CULTURE, BLOOD (ROUTINE X 2)
Culture: NO GROWTH
Culture: NO GROWTH

## 2022-10-12 ENCOUNTER — Ambulatory Visit: Payer: Medicare Other | Admitting: General Surgery

## 2022-10-18 ENCOUNTER — Encounter: Payer: Self-pay | Admitting: General Surgery

## 2022-10-18 ENCOUNTER — Ambulatory Visit (INDEPENDENT_AMBULATORY_CARE_PROVIDER_SITE_OTHER): Payer: Medicare Other | Admitting: General Surgery

## 2022-10-18 VITALS — BP 127/88 | HR 82 | Temp 98.6°F | Resp 14 | Ht 66.0 in | Wt 198.0 lb

## 2022-10-18 DIAGNOSIS — S301XXA Contusion of abdominal wall, initial encounter: Secondary | ICD-10-CM

## 2022-10-18 DIAGNOSIS — K562 Volvulus: Secondary | ICD-10-CM

## 2022-10-18 DIAGNOSIS — J0100 Acute maxillary sinusitis, unspecified: Secondary | ICD-10-CM

## 2022-10-18 MED ORDER — AZITHROMYCIN 250 MG PO TABS
ORAL_TABLET | ORAL | 0 refills | Status: AC
Start: 2022-10-18 — End: 2022-10-23

## 2022-10-18 NOTE — Patient Instructions (Addendum)
Keep abdominal wound covered. Do not submerge Expect some grayish drainage form the silver nitrate stick. Drainage should continue to lessen. Call if any redness on the skin.  Keep stools regular and soft No heavy lifting > 10 lbs, excessive bending, pushing, pulling, or squatting for 6-8 weeks after surgery.

## 2022-10-19 NOTE — Progress Notes (Signed)
[facility]  Doing fair. Having more Bms eating and feeling better with the reglan. Overall doing well. Does have some minor drainage form his midline wound at times. Says he put liquid skin on the midline wound to be able to swim in [place].   He is having recent coughing, congestion for about 1 week. This is making him more sore.   BP 127/88   Pulse 82   Temp 98.6 F (37 C) (Oral)   Resp 14   Ht 5\' 6"  (1.676 m)   Wt 198 lb (89.8 kg)   SpO2 96%   BMI 31.96 kg/m  Midline without erythema or drainage, midline liquid skin removed, wound probed and tracks superior, superficial seroma drained, silver nitrate to the pocket   Patient s/p right hemicolectomy for cecal volvulus. Doing well.    Keep abdominal wound covered. Do not submerge Expect some grayish drainage form the silver nitrate stick. Drainage should continue to lessen. Call if any redness on the skin.  Keep stools regular and soft No heavy lifting > 10 lbs, excessive bending, pushing, pulling, or squatting for 6-8 weeks after surgery.  Z pak for sinus infection  Algis Greenhouse, MD [facility] [address] [place], [place] [address] [phone] (office)

## 2022-11-15 ENCOUNTER — Ambulatory Visit: Payer: Medicare Other | Admitting: General Surgery

## 2022-11-18 ENCOUNTER — Other Ambulatory Visit: Payer: Self-pay

## 2022-11-18 ENCOUNTER — Emergency Department (HOSPITAL_COMMUNITY): Payer: Medicare Other

## 2022-11-18 ENCOUNTER — Encounter (HOSPITAL_COMMUNITY): Payer: Self-pay

## 2022-11-18 ENCOUNTER — Inpatient Hospital Stay (HOSPITAL_COMMUNITY)
Admission: EM | Admit: 2022-11-18 | Discharge: 2022-11-20 | DRG: 200 | Disposition: A | Discharge status: Home / Self Care | Payer: Medicare Other | Attending: General Surgery | Admitting: General Surgery

## 2022-11-18 DIAGNOSIS — R739 Hyperglycemia, unspecified: Secondary | ICD-10-CM | POA: Diagnosis not present

## 2022-11-18 DIAGNOSIS — T402X5A Adverse effect of other opioids, initial encounter: Secondary | ICD-10-CM | POA: Diagnosis not present

## 2022-11-18 DIAGNOSIS — Z9049 Acquired absence of other specified parts of digestive tract: Secondary | ICD-10-CM

## 2022-11-18 DIAGNOSIS — Z800 Family history of malignant neoplasm of digestive organs: Secondary | ICD-10-CM | POA: Diagnosis not present

## 2022-11-18 DIAGNOSIS — Z833 Family history of diabetes mellitus: Secondary | ICD-10-CM

## 2022-11-18 DIAGNOSIS — R112 Nausea with vomiting, unspecified: Secondary | ICD-10-CM | POA: Diagnosis not present

## 2022-11-18 DIAGNOSIS — Z83438 Family history of other disorder of lipoprotein metabolism and other lipidemia: Secondary | ICD-10-CM

## 2022-11-18 DIAGNOSIS — Z23 Encounter for immunization: Secondary | ICD-10-CM

## 2022-11-18 DIAGNOSIS — Z818 Family history of other mental and behavioral disorders: Secondary | ICD-10-CM

## 2022-11-18 DIAGNOSIS — Z79899 Other long term (current) drug therapy: Secondary | ICD-10-CM | POA: Diagnosis not present

## 2022-11-18 DIAGNOSIS — S0101XA Laceration without foreign body of scalp, initial encounter: Secondary | ICD-10-CM | POA: Diagnosis present

## 2022-11-18 DIAGNOSIS — D72829 Elevated white blood cell count, unspecified: Secondary | ICD-10-CM | POA: Diagnosis present

## 2022-11-18 DIAGNOSIS — S270XXA Traumatic pneumothorax, initial encounter: Secondary | ICD-10-CM | POA: Diagnosis present

## 2022-11-18 DIAGNOSIS — T1490XA Injury, unspecified, initial encounter: Secondary | ICD-10-CM | POA: Diagnosis present

## 2022-11-18 DIAGNOSIS — F411 Generalized anxiety disorder: Secondary | ICD-10-CM | POA: Diagnosis present

## 2022-11-18 DIAGNOSIS — E785 Hyperlipidemia, unspecified: Secondary | ICD-10-CM | POA: Diagnosis present

## 2022-11-18 DIAGNOSIS — S2242XA Multiple fractures of ribs, left side, initial encounter for closed fracture: Secondary | ICD-10-CM | POA: Diagnosis present

## 2022-11-18 DIAGNOSIS — I10 Essential (primary) hypertension: Secondary | ICD-10-CM | POA: Diagnosis present

## 2022-11-18 DIAGNOSIS — G4733 Obstructive sleep apnea (adult) (pediatric): Secondary | ICD-10-CM | POA: Diagnosis present

## 2022-11-18 DIAGNOSIS — Y92239 Unspecified place in hospital as the place of occurrence of the external cause: Secondary | ICD-10-CM | POA: Diagnosis not present

## 2022-11-18 DIAGNOSIS — Z8249 Family history of ischemic heart disease and other diseases of the circulatory system: Secondary | ICD-10-CM | POA: Diagnosis not present

## 2022-11-18 LAB — PROTIME-INR
INR: 1 (ref 0.8–1.2)
Prothrombin Time: 13.7 s (ref 11.4–15.2)

## 2022-11-18 LAB — COMPREHENSIVE METABOLIC PANEL
ALT: 54 U/L — ABNORMAL HIGH (ref 0–44)
AST: 33 U/L (ref 15–41)
Albumin: 3.7 g/dL (ref 3.5–5.0)
Alkaline Phosphatase: 71 U/L (ref 38–126)
Anion gap: 9 (ref 5–15)
BUN: 16 mg/dL (ref 6–20)
CO2: 19 mmol/L — ABNORMAL LOW (ref 22–32)
Calcium: 8.7 mg/dL — ABNORMAL LOW (ref 8.9–10.3)
Chloride: 108 mmol/L (ref 98–111)
Creatinine, Ser: 0.91 mg/dL (ref 0.61–1.24)
GFR, Estimated: >60 mL/min (ref >60)
Glucose, Bld: 124 mg/dL — ABNORMAL HIGH (ref 70–99)
Potassium: 3.8 mmol/L (ref 3.5–5.1)
Sodium: 136 mmol/L (ref 135–145)
Total Bilirubin: 0.7 mg/dL (ref <1.2)
Total Protein: 7.1 g/dL (ref 6.5–8.1)

## 2022-11-18 LAB — CBC
HCT: 41.5 % (ref 39.0–52.0)
HCT: 42.9 % (ref 39.0–52.0)
Hemoglobin: 13.9 g/dL (ref 13.0–17.0)
Hemoglobin: 14.2 g/dL (ref 13.0–17.0)
MCH: 28.8 pg (ref 26.0–34.0)
MCH: 28.7 pg (ref 26.0–34.0)
MCHC: 33.5 g/dL (ref 30.0–36.0)
MCHC: 33.1 g/dL (ref 30.0–36.0)
MCV: 85.9 fL (ref 80.0–100.0)
MCV: 86.8 fL (ref 80.0–100.0)
Platelets: 242 10*3/uL (ref 150–400)
Platelets: 185 10*3/uL (ref 150–400)
RBC: 4.83 MIL/uL (ref 4.22–5.81)
RBC: 4.94 MIL/uL (ref 4.22–5.81)
RDW: 14.3 % (ref 11.5–15.5)
RDW: 14.2 % (ref 11.5–15.5)
WBC: 17 10*3/uL — ABNORMAL HIGH (ref 4.0–10.5)
WBC: 16.5 10*3/uL — ABNORMAL HIGH (ref 4.0–10.5)
nRBC: 0 % (ref 0.0–0.2)
nRBC: 0 % (ref 0.0–0.2)

## 2022-11-18 LAB — URINALYSIS, ROUTINE W REFLEX MICROSCOPIC
Bilirubin Urine: NEGATIVE
Glucose, UA: 50 mg/dL — AB
Hgb urine dipstick: NEGATIVE
Ketones, ur: 5 mg/dL — AB
Leukocytes,Ua: NEGATIVE
Nitrite: NEGATIVE
Protein, ur: NEGATIVE mg/dL
Specific Gravity, Urine: >1.046 — ABNORMAL HIGH (ref 1.005–1.030)
pH: 5 (ref 5.0–8.0)

## 2022-11-18 LAB — I-STAT CHEM 8, ED
BUN: 26 mg/dL — ABNORMAL HIGH (ref 6–20)
Calcium, Ion: 1.03 mmol/L — ABNORMAL LOW (ref 1.15–1.40)
Chloride: 109 mmol/L (ref 98–111)
Creatinine, Ser: 0.9 mg/dL (ref 0.61–1.24)
Glucose, Bld: 127 mg/dL — ABNORMAL HIGH (ref 70–99)
HCT: 43 % (ref 39.0–52.0)
Hemoglobin: 14.6 g/dL (ref 13.0–17.0)
Potassium: 5.8 mmol/L — ABNORMAL HIGH (ref 3.5–5.1)
Sodium: 138 mmol/L (ref 135–145)
TCO2: 24 mmol/L (ref 22–32)

## 2022-11-18 LAB — SAMPLE TO BLOOD BANK

## 2022-11-18 LAB — ETHANOL: Alcohol, Ethyl (B): <10 mg/dL (ref <10)

## 2022-11-18 LAB — CREATININE, SERUM
Creatinine, Ser: 0.9 mg/dL (ref 0.61–1.24)
GFR, Estimated: >60 mL/min (ref >60)

## 2022-11-18 LAB — I-STAT CG4 LACTIC ACID, ED: Lactic Acid, Venous: 1.3 mmol/L (ref 0.5–1.9)

## 2022-11-18 MED ORDER — HYDROMORPHONE HCL 1 MG/ML IJ SOLN
1.0000 mg | INTRAMUSCULAR | Status: DC | PRN
  Administered 2022-11-19 (×3): 1 mg via INTRAVENOUS
  Filled 2022-11-18 (×3): qty 1

## 2022-11-18 MED ORDER — OXYCODONE HCL 5 MG PO TABS
5.0000 mg | ORAL_TABLET | ORAL | Status: DC | PRN
  Administered 2022-11-18: 5 mg via ORAL
  Filled 2022-11-18: qty 1

## 2022-11-18 MED ORDER — DOCUSATE SODIUM 100 MG PO CAPS
100.0000 mg | ORAL_CAPSULE | Freq: Two times a day (BID) | ORAL | Status: DC
  Administered 2022-11-18 – 2022-11-20 (×4): 100 mg via ORAL
  Filled 2022-11-18 (×4): qty 1

## 2022-11-18 MED ORDER — ACETAMINOPHEN 500 MG PO TABS
1000.0000 mg | ORAL_TABLET | Freq: Four times a day (QID) | ORAL | Status: DC
  Administered 2022-11-18 – 2022-11-20 (×7): 1000 mg via ORAL
  Filled 2022-11-18 (×7): qty 2

## 2022-11-18 MED ORDER — IOHEXOL 350 MG/ML SOLN
75.0000 mL | Freq: Once | INTRAVENOUS | Status: AC | PRN
  Administered 2022-11-18: 75 mL via INTRAVENOUS

## 2022-11-18 MED ORDER — HYDROMORPHONE HCL 1 MG/ML IJ SOLN
1.0000 mg | Freq: Once | INTRAMUSCULAR | Status: AC
  Administered 2022-11-18: 1 mg via INTRAVENOUS
  Filled 2022-11-18: qty 1

## 2022-11-18 MED ORDER — METHOCARBAMOL 500 MG PO TABS
500.0000 mg | ORAL_TABLET | Freq: Three times a day (TID) | ORAL | Status: DC
  Administered 2022-11-18 – 2022-11-19 (×2): 500 mg via ORAL
  Filled 2022-11-18 (×2): qty 1

## 2022-11-18 MED ORDER — ONDANSETRON HCL 4 MG/2ML IJ SOLN
4.0000 mg | Freq: Four times a day (QID) | INTRAMUSCULAR | Status: DC | PRN
  Administered 2022-11-18 – 2022-11-19 (×2): 4 mg via INTRAVENOUS
  Filled 2022-11-18 (×2): qty 2

## 2022-11-18 MED ORDER — POLYETHYLENE GLYCOL 3350 17 G PO PACK
17.0000 g | PACK | Freq: Every day | ORAL | Status: DC | PRN
Start: 2022-11-18 — End: 2022-11-20

## 2022-11-18 MED ORDER — MORPHINE SULFATE (PF) 4 MG/ML IV SOLN
INTRAVENOUS | Status: AC
  Filled 2022-11-18: qty 1

## 2022-11-18 MED ORDER — KETOROLAC TROMETHAMINE 30 MG/ML IJ SOLN
30.0000 mg | Freq: Once | INTRAMUSCULAR | Status: AC
Start: 2022-11-18 — End: 2022-11-18
  Administered 2022-11-18: 30 mg via INTRAVENOUS
  Filled 2022-11-18: qty 1

## 2022-11-18 MED ORDER — HYDRALAZINE HCL 20 MG/ML IJ SOLN: 10.0000 mg | INTRAMUSCULAR | Status: DC | PRN

## 2022-11-18 MED ORDER — KETOROLAC TROMETHAMINE 30 MG/ML IJ SOLN
30.0000 mg | Freq: Four times a day (QID) | INTRAMUSCULAR | Status: DC
  Administered 2022-11-19 – 2022-11-20 (×6): 30 mg via INTRAVENOUS
  Filled 2022-11-18 (×6): qty 1

## 2022-11-18 MED ORDER — TETANUS-DIPHTH-ACELL PERTUSSIS 5-2.5-18.5 LF-MCG/0.5 IM SUSY
0.5000 mL | PREFILLED_SYRINGE | Freq: Once | INTRAMUSCULAR | Status: AC
  Administered 2022-11-18: 0.5 mL via INTRAMUSCULAR
  Filled 2022-11-18: qty 0.5

## 2022-11-18 MED ORDER — LIDOCAINE-EPINEPHRINE (PF) 2 %-1:200000 IJ SOLN
10.0000 mL | Freq: Once | INTRAMUSCULAR | Status: AC
  Administered 2022-11-18: 10 mL
  Filled 2022-11-18: qty 20

## 2022-11-18 MED ORDER — METOPROLOL TARTRATE 5 MG/5ML IV SOLN: 5.0000 mg | Freq: Four times a day (QID) | INTRAVENOUS | Status: DC | PRN

## 2022-11-18 MED ORDER — ENOXAPARIN SODIUM 30 MG/0.3ML IJ SOSY
30.0000 mg | PREFILLED_SYRINGE | INTRAMUSCULAR | Status: DC
  Administered 2022-11-19 – 2022-11-20 (×2): 30 mg via SUBCUTANEOUS
  Filled 2022-11-18 (×2): qty 0.3

## 2022-11-18 MED ORDER — ONDANSETRON 4 MG PO TBDP: 4.0000 mg | ORAL_TABLET | Freq: Four times a day (QID) | ORAL | Status: DC | PRN

## 2022-11-18 MED ORDER — LIDOCAINE 5 % EX PTCH
1.0000 | MEDICATED_PATCH | Freq: Once | CUTANEOUS | Status: AC
  Administered 2022-11-18: 1 via TRANSDERMAL
  Filled 2022-11-18: qty 1

## 2022-11-18 MED ORDER — METHOCARBAMOL 1000 MG/10ML IJ SOLN
500.0000 mg | Freq: Three times a day (TID) | INTRAMUSCULAR | Status: DC
  Filled 2022-11-18: qty 10

## 2022-11-18 MED ORDER — GABAPENTIN 300 MG PO CAPS
300.0000 mg | ORAL_CAPSULE | Freq: Three times a day (TID) | ORAL | Status: DC
  Administered 2022-11-18 – 2022-11-20 (×5): 300 mg via ORAL
  Filled 2022-11-18 (×5): qty 1

## 2022-11-18 MED ORDER — MORPHINE SULFATE (PF) 4 MG/ML IV SOLN
4.0000 mg | Freq: Once | INTRAVENOUS | Status: AC
  Administered 2022-11-18: 4 mg via INTRAVENOUS
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
## 2022-11-18 NOTE — ED Notes (Signed)
Pt placed on 2L NC per providers orders.
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
## 2022-11-18 NOTE — ED Notes (Signed)
X-ray at bedside

## 2022-11-18 NOTE — ED Notes (Signed)
Transport to CT
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
## 2022-11-19 ENCOUNTER — Inpatient Hospital Stay (HOSPITAL_COMMUNITY): Payer: Medicare Other

## 2022-11-19 LAB — CBC
HCT: 41.4 % (ref 39.0–52.0)
Hemoglobin: 13.8 g/dL (ref 13.0–17.0)
MCH: 28.6 pg (ref 26.0–34.0)
MCHC: 33.3 g/dL (ref 30.0–36.0)
MCV: 85.7 fL (ref 80.0–100.0)
Platelets: 286 10*3/uL (ref 150–400)
RBC: 4.83 MIL/uL (ref 4.22–5.81)
RDW: 14.5 % (ref 11.5–15.5)
WBC: 11.6 10*3/uL — ABNORMAL HIGH (ref 4.0–10.5)
nRBC: 0 % (ref 0.0–0.2)

## 2022-11-19 LAB — BASIC METABOLIC PANEL
Anion gap: 6 (ref 5–15)
BUN: 16 mg/dL (ref 6–20)
CO2: 27 mmol/L (ref 22–32)
Calcium: 8.8 mg/dL — ABNORMAL LOW (ref 8.9–10.3)
Chloride: 106 mmol/L (ref 98–111)
Creatinine, Ser: 1.01 mg/dL (ref 0.61–1.24)
GFR, Estimated: >60 mL/min (ref >60)
Glucose, Bld: 118 mg/dL — ABNORMAL HIGH (ref 70–99)
Potassium: 3.5 mmol/L (ref 3.5–5.1)
Sodium: 139 mmol/L (ref 135–145)

## 2022-11-19 MED ORDER — MORPHINE SULFATE (PF) 2 MG/ML IV SOLN
1.0000 mg | INTRAVENOUS | Status: DC | PRN
Start: 2022-11-19 — End: 2022-11-19
  Filled 2022-11-19: qty 1

## 2022-11-19 MED ORDER — HYDROMORPHONE HCL 1 MG/ML IJ SOLN: 1.0000 mg | INTRAMUSCULAR | Status: DC | PRN

## 2022-11-19 MED ORDER — METHOCARBAMOL 750 MG PO TABS
750.0000 mg | ORAL_TABLET | Freq: Three times a day (TID) | ORAL | Status: DC
  Administered 2022-11-19 – 2022-11-20 (×4): 750 mg via ORAL
  Filled 2022-11-19 (×4): qty 1

## 2022-11-19 MED ORDER — METHOCARBAMOL 1000 MG/10ML IJ SOLN: 750.0000 mg | Freq: Three times a day (TID) | INTRAMUSCULAR | Status: DC

## 2022-11-19 MED ORDER — HYDROMORPHONE HCL 1 MG/ML IJ SOLN
1.0000 mg | INTRAMUSCULAR | Status: DC | PRN
Start: 2022-11-19 — End: 2022-11-20
  Administered 2022-11-19 – 2022-11-20 (×5): 2 mg via INTRAVENOUS
  Filled 2022-11-19 (×6): qty 2

## 2022-11-19 MED ORDER — OXYCODONE HCL 5 MG PO TABS
5.0000 mg | ORAL_TABLET | ORAL | Status: DC | PRN
  Administered 2022-11-19 (×2): 10 mg via ORAL
  Filled 2022-11-19 (×3): qty 2

## 2022-11-19 MED ORDER — PROCHLORPERAZINE EDISYLATE 10 MG/2ML IJ SOLN
10.0000 mg | Freq: Four times a day (QID) | INTRAMUSCULAR | Status: DC | PRN
  Administered 2022-11-19: 10 mg via INTRAVENOUS
  Filled 2022-11-19: qty 2
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
## 2022-11-19 NOTE — Progress Notes (Signed)
MD notified that patient is having nausea with vomiting, unrelieved by zofran.

## 2022-11-19 NOTE — Progress Notes (Signed)
Order received from Almond Lint MD for dilaudid prn

## 2022-11-19 NOTE — Plan of Care (Signed)

## 2022-11-19 NOTE — TOC CAGE-AID Note (Signed)
Transition of Care [facility]) - CAGE-AID Screening   Patient Details  Name: William Mcdonald MRN: [id number] Date of Birth: Jul 05, 1975  Hewitt Shorts, RN Trauma Response Nurse Phone Number: [phone] 11/19/2022, 1:07 PM    CAGE-AID Screening:    Have You Ever Felt You Ought to Cut Down on Your Drinking or Drug Use?: No Have People Annoyed You By [employer] Your Drinking Or Drug Use?: No Have You Felt Bad Or Guilty About Your Drinking Or Drug Use?: No Have You Ever Had a Drink or Used Drugs First Thing In The Morning to Steady Your Nerves or to Get Rid of a Hangover?: No CAGE-AID Score: 0  Substance Abuse Education Offered: No (denies alcohol or drug use. No services necessary)

## 2022-11-20 ENCOUNTER — Inpatient Hospital Stay (HOSPITAL_COMMUNITY): Payer: Medicare Other

## 2022-11-20 LAB — CBC
HCT: 39.1 % (ref 39.0–52.0)
Hemoglobin: 12.8 g/dL — ABNORMAL LOW (ref 13.0–17.0)
MCH: 28.5 pg (ref 26.0–34.0)
MCHC: 32.7 g/dL (ref 30.0–36.0)
MCV: 87.1 fL (ref 80.0–100.0)
Platelets: 242 10*3/uL (ref 150–400)
RBC: 4.49 MIL/uL (ref 4.22–5.81)
RDW: 14.6 % (ref 11.5–15.5)
WBC: 9.5 10*3/uL (ref 4.0–10.5)
nRBC: 0 % (ref 0.0–0.2)

## 2022-11-20 LAB — BASIC METABOLIC PANEL
Anion gap: 6 (ref 5–15)
BUN: 16 mg/dL (ref 6–20)
CO2: 26 mmol/L (ref 22–32)
Calcium: 8.4 mg/dL — ABNORMAL LOW (ref 8.9–10.3)
Chloride: 104 mmol/L (ref 98–111)
Creatinine, Ser: 1.01 mg/dL (ref 0.61–1.24)
GFR, Estimated: >60 mL/min (ref >60)
Glucose, Bld: 103 mg/dL — ABNORMAL HIGH (ref 70–99)
Potassium: 3.4 mmol/L — ABNORMAL LOW (ref 3.5–5.1)
Sodium: 136 mmol/L (ref 135–145)

## 2022-11-20 MED ORDER — POLYETHYLENE GLYCOL 3350 17 G PO PACK
17.0000 g | PACK | Freq: Every day | ORAL | Status: DC
  Administered 2022-11-20: 17 g via ORAL
  Filled 2022-11-20: qty 1

## 2022-11-20 MED ORDER — METHOCARBAMOL 750 MG PO TABS: 750.0000 mg | ORAL_TABLET | Freq: Three times a day (TID) | ORAL | 1 refills | Status: AC | PRN

## 2022-11-20 MED ORDER — SENNA 8.6 MG PO TABS
1.0000 | ORAL_TABLET | Freq: Two times a day (BID) | ORAL | Status: DC
  Administered 2022-11-20: 8.6 mg via ORAL
  Filled 2022-11-20: qty 1

## 2022-11-20 MED ORDER — GABAPENTIN 300 MG PO CAPS: 300.0000 mg | ORAL_CAPSULE | Freq: Three times a day (TID) | ORAL | 1 refills | Status: AC

## 2022-11-20 MED ORDER — ACETAMINOPHEN 500 MG PO TABS: 1000.0000 mg | ORAL_TABLET | Freq: Three times a day (TID) | ORAL | 0 refills | Status: AC | PRN

## 2022-11-20 MED ORDER — IBUPROFEN 800 MG PO TABS: 800.0000 mg | ORAL_TABLET | Freq: Three times a day (TID) | ORAL | 0 refills | Status: AC

## 2022-11-20 MED ORDER — SENNA 8.6 MG PO TABS: 1.0000 | ORAL_TABLET | Freq: Two times a day (BID) | ORAL | 0 refills | Status: AC

## 2022-11-20 MED ORDER — POTASSIUM CHLORIDE CRYS ER 20 MEQ PO TBCR
40.0000 meq | EXTENDED_RELEASE_TABLET | Freq: Every day | ORAL | Status: DC
  Administered 2022-11-20: 40 meq via ORAL
  Filled 2022-11-20: qty 2

## 2022-11-20 MED ORDER — OXYCODONE HCL 5 MG PO TABS: 5.0000 mg | ORAL_TABLET | ORAL | 0 refills | Status: AC | PRN

## 2022-11-20 MED ORDER — POLYETHYLENE GLYCOL 3350 17 G PO PACK: 17.0000 g | PACK | Freq: Every day | ORAL | 0 refills | Status: AC

## 2022-11-20 MED ORDER — POTASSIUM CHLORIDE CRYS ER 20 MEQ PO TBCR: 40.0000 meq | EXTENDED_RELEASE_TABLET | Freq: Every day | ORAL | 0 refills | Status: AC

## 2022-11-20 MED ORDER — ONDANSETRON 4 MG PO TBDP: 4.0000 mg | ORAL_TABLET | Freq: Four times a day (QID) | ORAL | 0 refills | Status: AC | PRN
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
## 2022-11-22 ENCOUNTER — Other Ambulatory Visit (HOSPITAL_COMMUNITY): Payer: Self-pay | Admitting: General Surgery

## 2022-11-22 ENCOUNTER — Encounter: Payer: Self-pay | Admitting: General Surgery

## 2022-11-22 DIAGNOSIS — J939 Pneumothorax, unspecified: Secondary | ICD-10-CM
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
## 2022-11-29 ENCOUNTER — Emergency Department (HOSPITAL_COMMUNITY)
Admission: EM | Admit: 2022-11-29 | Discharge: 2022-11-29 | Disposition: A | Discharge status: Home / Self Care | Payer: Self-pay

## 2022-11-29 ENCOUNTER — Encounter (HOSPITAL_COMMUNITY): Payer: Self-pay | Admitting: Emergency Medicine

## 2022-11-29 ENCOUNTER — Emergency Department (HOSPITAL_COMMUNITY): Payer: Self-pay

## 2022-11-29 ENCOUNTER — Other Ambulatory Visit: Payer: Self-pay

## 2022-11-29 DIAGNOSIS — S2242XD Multiple fractures of ribs, left side, subsequent encounter for fracture with routine healing: Secondary | ICD-10-CM | POA: Insufficient documentation

## 2022-11-29 DIAGNOSIS — Z4802 Encounter for removal of sutures: Secondary | ICD-10-CM | POA: Insufficient documentation

## 2022-11-29 MED ORDER — METHOCARBAMOL 1000 MG/10ML IJ SOLN
750.0000 mg | Freq: Once | INTRAMUSCULAR | Status: AC
  Administered 2022-11-29: 750 mg via INTRAVENOUS
  Filled 2022-11-29: qty 7.5

## 2022-11-29 MED ORDER — OXYCODONE-ACETAMINOPHEN 5-325 MG PO TABS
1.0000 | ORAL_TABLET | Freq: Once | ORAL | Status: AC
  Administered 2022-11-29: 1 via ORAL
  Filled 2022-11-29: qty 1
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
## 2022-11-29 NOTE — Discharge Instructions (Addendum)
Your muscle relaxer is written at a maximum safe dose. Continue this 3 times daily. Take your oxycodone as prescribed. You can also take ibuprofen 600 mg (3 over-the-counter strength tablets) every 6 hours.   Your potassium was 3.4 when discharged from the hospital which is a mild decrease. You can stop the potassium supplements but should have this rechecked later this week or early next week.   Follow up with your doctor as needed for persistent pain. Return to the ED with any new or worsening symptoms at any time.
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
## 2022-11-29 NOTE — ED Notes (Signed)
Pt given crackers, peanut butter, lean cuisine meal, and water.
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
## 2023-03-16 ENCOUNTER — Emergency Department (HOSPITAL_COMMUNITY): Payer: Self-pay

## 2023-03-16 ENCOUNTER — Encounter (HOSPITAL_COMMUNITY): Payer: Self-pay | Admitting: Emergency Medicine

## 2023-03-16 ENCOUNTER — Emergency Department (HOSPITAL_COMMUNITY)
Admission: EM | Admit: 2023-03-16 | Discharge: 2023-03-16 | Disposition: A | Discharge status: Home / Self Care | Payer: Self-pay | Attending: Emergency Medicine | Admitting: Emergency Medicine

## 2023-03-16 DIAGNOSIS — R079 Chest pain, unspecified: Secondary | ICD-10-CM

## 2023-03-16 DIAGNOSIS — R0789 Other chest pain: Secondary | ICD-10-CM | POA: Insufficient documentation

## 2023-03-16 DIAGNOSIS — I10 Essential (primary) hypertension: Secondary | ICD-10-CM | POA: Insufficient documentation

## 2023-03-16 DIAGNOSIS — J45909 Unspecified asthma, uncomplicated: Secondary | ICD-10-CM | POA: Insufficient documentation

## 2023-03-16 DIAGNOSIS — R109 Unspecified abdominal pain: Secondary | ICD-10-CM | POA: Insufficient documentation

## 2023-03-16 LAB — COMPREHENSIVE METABOLIC PANEL
ALT: 34 U/L (ref 0–44)
AST: 23 U/L (ref 15–41)
Albumin: 3.6 g/dL (ref 3.5–5.0)
Alkaline Phosphatase: 58 U/L (ref 38–126)
Anion gap: 7 (ref 5–15)
BUN: 16 mg/dL (ref 6–20)
CO2: 23 mmol/L (ref 22–32)
Calcium: 8.7 mg/dL — ABNORMAL LOW (ref 8.9–10.3)
Chloride: 108 mmol/L (ref 98–111)
Creatinine, Ser: 0.87 mg/dL (ref 0.61–1.24)
GFR, Estimated: >60 mL/min (ref >60)
Glucose, Bld: 129 mg/dL — ABNORMAL HIGH (ref 70–99)
Potassium: 3.5 mmol/L (ref 3.5–5.1)
Sodium: 138 mmol/L (ref 135–145)
Total Bilirubin: 0.5 mg/dL (ref 0.0–1.2)
Total Protein: 6.7 g/dL (ref 6.5–8.1)

## 2023-03-16 LAB — CBC
HCT: 44.1 % (ref 39.0–52.0)
Hemoglobin: 14.8 g/dL (ref 13.0–17.0)
MCH: 29.5 pg (ref 26.0–34.0)
MCHC: 33.6 g/dL (ref 30.0–36.0)
MCV: 87.8 fL (ref 80.0–100.0)
Platelets: 260 10*3/uL (ref 150–400)
RBC: 5.02 MIL/uL (ref 4.22–5.81)
RDW: 14 % (ref 11.5–15.5)
WBC: 7.5 10*3/uL (ref 4.0–10.5)
nRBC: 0 % (ref 0.0–0.2)

## 2023-03-16 LAB — TROPONIN I (HIGH SENSITIVITY)
Troponin I (High Sensitivity): 2 ng/L (ref <18)
Troponin I (High Sensitivity): 3 ng/L (ref <18)

## 2023-03-16 LAB — LIPASE, BLOOD: Lipase: 32 U/L (ref 11–51)

## 2023-03-16 NOTE — ED Triage Notes (Signed)
 Pt woke up with chest pain. States will sometimes get chest pain when eating greasy food (ate pizza tonight) but this feels different. Pain in chest radiating down R shoulder. Sharp in nature. Hands/fingertips feeling numb with worsening on right side. EMS gave 1 SL nitroglycerin and pain did improve.

## 2023-03-16 NOTE — Discharge Instructions (Signed)
 You were evaluated in the Emergency Department and after careful evaluation, we did not find any emergent condition requiring admission or further testing in the hospital.  Your exam/testing today is overall reassuring.  Recommend discussing your symptoms with your primary care doctor.  Please return to the Emergency Department if you experience any worsening of your condition.   Thank you for allowing [place] to be a part of your care.
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
